# Patient Record
Sex: Female | Born: 1960 | Race: White | Hispanic: No | Marital: Married | Smoking: Former smoker
Health system: Southern US, Community
[De-identification: ages and names within clinical notes are randomized; demographics above are authoritative.]

## PROBLEM LIST (undated history)

## (undated) DIAGNOSIS — C801 Malignant (primary) neoplasm, unspecified: Secondary | ICD-10-CM

## (undated) DIAGNOSIS — N289 Disorder of kidney and ureter, unspecified: Secondary | ICD-10-CM

## (undated) DIAGNOSIS — F32A Depression, unspecified: Secondary | ICD-10-CM

## (undated) DIAGNOSIS — F329 Major depressive disorder, single episode, unspecified: Secondary | ICD-10-CM

## (undated) DIAGNOSIS — F419 Anxiety disorder, unspecified: Secondary | ICD-10-CM

## (undated) HISTORY — PX: TUBAL LIGATION: SHX77

---

## 2004-12-10 ENCOUNTER — Ambulatory Visit: Payer: Self-pay | Admitting: Obstetrics and Gynecology

## 2005-12-09 ENCOUNTER — Encounter: Payer: Self-pay | Admitting: Orthopedic Surgery

## 2005-12-30 ENCOUNTER — Ambulatory Visit: Payer: Self-pay | Admitting: Obstetrics and Gynecology

## 2006-01-03 ENCOUNTER — Ambulatory Visit: Payer: Self-pay | Admitting: Obstetrics and Gynecology

## 2006-07-04 ENCOUNTER — Ambulatory Visit: Payer: Self-pay | Admitting: Obstetrics and Gynecology

## 2007-01-27 ENCOUNTER — Ambulatory Visit: Payer: Self-pay | Admitting: Obstetrics and Gynecology

## 2007-11-09 ENCOUNTER — Ambulatory Visit: Payer: Self-pay | Admitting: Emergency Medicine

## 2008-02-01 ENCOUNTER — Ambulatory Visit: Payer: Self-pay | Admitting: Obstetrics and Gynecology

## 2009-02-02 ENCOUNTER — Ambulatory Visit: Payer: Self-pay | Admitting: Obstetrics and Gynecology

## 2009-05-31 ENCOUNTER — Ambulatory Visit: Payer: Self-pay | Admitting: Family Medicine

## 2009-11-02 ENCOUNTER — Ambulatory Visit: Payer: Self-pay | Admitting: Urology

## 2010-02-12 ENCOUNTER — Ambulatory Visit: Payer: Self-pay | Admitting: Obstetrics and Gynecology

## 2011-02-14 ENCOUNTER — Ambulatory Visit: Payer: Self-pay | Admitting: Obstetrics and Gynecology

## 2011-03-22 ENCOUNTER — Ambulatory Visit: Payer: Self-pay | Admitting: Internal Medicine

## 2012-02-17 ENCOUNTER — Ambulatory Visit: Payer: Self-pay | Admitting: Obstetrics and Gynecology

## 2012-03-24 ENCOUNTER — Ambulatory Visit: Payer: Self-pay | Admitting: Unknown Physician Specialty

## 2012-03-25 LAB — PATHOLOGY REPORT

## 2013-02-24 ENCOUNTER — Ambulatory Visit: Payer: Self-pay | Admitting: Obstetrics and Gynecology

## 2014-03-01 ENCOUNTER — Ambulatory Visit: Payer: Self-pay | Admitting: Obstetrics and Gynecology

## 2014-03-10 ENCOUNTER — Ambulatory Visit: Payer: Self-pay | Admitting: Obstetrics and Gynecology

## 2015-05-08 ENCOUNTER — Other Ambulatory Visit: Payer: Self-pay | Admitting: Family Medicine

## 2015-05-11 ENCOUNTER — Other Ambulatory Visit: Payer: Self-pay | Admitting: Family Medicine

## 2015-05-11 DIAGNOSIS — Z87898 Personal history of other specified conditions: Secondary | ICD-10-CM

## 2015-05-11 DIAGNOSIS — Z8742 Personal history of other diseases of the female genital tract: Secondary | ICD-10-CM

## 2015-05-24 ENCOUNTER — Other Ambulatory Visit: Payer: Self-pay | Admitting: Family Medicine

## 2015-05-24 ENCOUNTER — Ambulatory Visit: Payer: Self-pay

## 2015-05-24 ENCOUNTER — Ambulatory Visit: Payer: BLUE CROSS/BLUE SHIELD

## 2015-05-24 ENCOUNTER — Ambulatory Visit
Admission: RE | Admit: 2015-05-24 | Discharge: 2015-05-24 | Disposition: A | Discharge status: Home / Self Care | Payer: BLUE CROSS/BLUE SHIELD | Source: Ambulatory Visit | Attending: Family Medicine | Admitting: Family Medicine

## 2015-05-24 DIAGNOSIS — Z8742 Personal history of other diseases of the female genital tract: Secondary | ICD-10-CM

## 2015-05-24 DIAGNOSIS — Z87898 Personal history of other specified conditions: Secondary | ICD-10-CM

## 2015-05-24 DIAGNOSIS — N63 Unspecified lump in breast: Secondary | ICD-10-CM | POA: Insufficient documentation

## 2015-05-24 DIAGNOSIS — Z09 Encounter for follow-up examination after completed treatment for conditions other than malignant neoplasm: Secondary | ICD-10-CM | POA: Diagnosis present

## 2015-05-24 HISTORY — DX: Malignant (primary) neoplasm, unspecified: C80.1

## 2015-11-07 ENCOUNTER — Emergency Department
Admission: EM | Admit: 2015-11-07 | Discharge: 2015-11-07 | Disposition: A | Discharge status: Home / Self Care | Payer: BLUE CROSS/BLUE SHIELD

## 2015-11-07 ENCOUNTER — Ambulatory Visit (INDEPENDENT_AMBULATORY_CARE_PROVIDER_SITE_OTHER)
Admit: 2015-11-07 | Discharge: 2015-11-07 | Disposition: A | Discharge status: Home / Self Care | Payer: BLUE CROSS/BLUE SHIELD | Attending: Family Medicine | Admitting: Family Medicine

## 2015-11-07 ENCOUNTER — Encounter: Payer: Self-pay | Admitting: Emergency Medicine

## 2015-11-07 ENCOUNTER — Ambulatory Visit
Admission: EM | Admit: 2015-11-07 | Discharge: 2015-11-07 | Disposition: A | Discharge status: Home / Self Care | Payer: BLUE CROSS/BLUE SHIELD | Attending: Family Medicine | Admitting: Family Medicine

## 2015-11-07 ENCOUNTER — Ambulatory Visit
Admit: 2015-11-07 | Discharge: 2015-11-07 | Disposition: A | Discharge status: Home / Self Care | Payer: BLUE CROSS/BLUE SHIELD | Attending: Family Medicine | Admitting: Family Medicine

## 2015-11-07 DIAGNOSIS — N200 Calculus of kidney: Secondary | ICD-10-CM | POA: Insufficient documentation

## 2015-11-07 DIAGNOSIS — N390 Urinary tract infection, site not specified: Secondary | ICD-10-CM

## 2015-11-07 DIAGNOSIS — N201 Calculus of ureter: Secondary | ICD-10-CM | POA: Diagnosis not present

## 2015-11-07 DIAGNOSIS — K5790 Diverticulosis of intestine, part unspecified, without perforation or abscess without bleeding: Secondary | ICD-10-CM | POA: Diagnosis not present

## 2015-11-07 DIAGNOSIS — R109 Unspecified abdominal pain: Secondary | ICD-10-CM | POA: Diagnosis present

## 2015-11-07 DIAGNOSIS — N1330 Unspecified hydronephrosis: Secondary | ICD-10-CM

## 2015-11-07 HISTORY — DX: Disorder of kidney and ureter, unspecified: N28.9

## 2015-11-07 LAB — BASIC METABOLIC PANEL
Anion gap: 9 (ref 5–15)
BUN: 25 mg/dL — AB (ref 6–20)
CALCIUM: 8.8 mg/dL — AB (ref 8.9–10.3)
CO2: 22 mmol/L (ref 22–32)
CREATININE: 0.65 mg/dL (ref 0.44–1.00)
Chloride: 104 mmol/L (ref 101–111)
GFR calc non Af Amer: >60 mL/min (ref >60)
Glucose, Bld: 120 mg/dL — ABNORMAL HIGH (ref 65–99)
Potassium: 3.7 mmol/L (ref 3.5–5.1)
SODIUM: 135 mmol/L (ref 135–145)

## 2015-11-07 LAB — URINALYSIS COMPLETE WITH MICROSCOPIC (ARMC ONLY)
Glucose, UA: NEGATIVE mg/dL
Ketones, ur: NEGATIVE mg/dL
NITRITE: NEGATIVE
PH: 6 (ref 5.0–8.0)
Protein, ur: 100 mg/dL — AB
SPECIFIC GRAVITY, URINE: 1.025 (ref 1.005–1.030)

## 2015-11-07 MED ORDER — HYDROCODONE-ACETAMINOPHEN 5-325 MG PO TABS
1.0000 | ORAL_TABLET | Freq: Once | ORAL | Status: AC
  Administered 2015-11-07: 1 via ORAL

## 2015-11-07 MED ORDER — CIPROFLOXACIN HCL 500 MG PO TABS: 500.0000 mg | ORAL_TABLET | Freq: Two times a day (BID) | ORAL | Status: DC

## 2015-11-07 MED ORDER — KETOROLAC TROMETHAMINE 60 MG/2ML IM SOLN
60.0000 mg | Freq: Once | INTRAMUSCULAR | Status: AC
  Administered 2015-11-07: 60 mg via INTRAMUSCULAR

## 2015-11-07 MED ORDER — TAMSULOSIN HCL 0.4 MG PO CAPS: 0.4000 mg | ORAL_CAPSULE | Freq: Every day | ORAL | Status: DC

## 2015-11-07 MED ORDER — HYDROCODONE-ACETAMINOPHEN 5-325 MG PO TABS: 1.0000 | ORAL_TABLET | Freq: Four times a day (QID) | ORAL | Status: DC | PRN

## 2015-11-07 MED ORDER — ONDANSETRON 8 MG PO TBDP
8.0000 mg | ORAL_TABLET | Freq: Once | ORAL | Status: AC
  Administered 2015-11-07: 8 mg via ORAL

## 2015-11-07 NOTE — ED Notes (Signed)
Patient scheduled for Renal Ultrasound for 1:30pm today at [facility].

## 2015-11-07 NOTE — ED Notes (Signed)
Patient c/o burning when urinating, blood in urine and left sided flank pain that started last night. Patient reports some nausea.

## 2015-11-07 NOTE — ED Provider Notes (Signed)
CSN: [id number]     Arrival date & time 11/07/15  1039 History   First MD Initiated Contact with Patient 11/07/15 1250     Chief Complaint  Patient presents with  . Dysuria   (Consider location/radiation/quality/duration/timing/severity/associated sxs/prior Treatment) HPI Comments: 54 yo female with a 1 day h/o burning with urination, blood in urine and left sided flank pain, associated with nausea. Denies any fevers, chills, vomiting, diarrhea.  The history is provided by the patient.    Past Medical History  Diagnosis Date  . Depression   . Anxiety   . Renal disorder     stones  . Cancer ([place])     skin cancer - squamous carcina   Past Surgical History  Procedure Laterality Date  . Cesarean section    . Cystoscopy with stent placement Bilateral 11/09/2015    Procedure: CYSTOSCOPY WITH STENT PLACEMENT;  Surgeon: Royston Cowper, MD;  Location: [facility] ORS;  Service: Urology;  Laterality: Bilateral;   History reviewed. No pertinent family history. Social History  Substance Use Topics  . Smoking status: Former Research scientist (life sciences)  . Smokeless tobacco: None  . Alcohol Use: Yes   OB History    No data available     Review of Systems  Allergies  Review of patient's allergies indicates no known allergies.  Home Medications   Prior to Admission medications   Medication Sig Start Date End Date Taking? Authorizing Provider  venlafaxine (EFFEXOR) 37.5 MG tablet Take 37.5 mg by mouth daily.   Yes Historical Provider, MD  ciprofloxacin (CIPRO) 500 MG tablet Take 1 tablet (500 mg total) by mouth every 12 (twelve) hours. 11/07/15   Norval Gable, MD  ciprofloxacin (CIPRO) 500 MG tablet Take 1 tablet (500 mg total) by mouth 2 (two) times daily. 11/09/15   Royston Cowper, MD  docusate sodium (COLACE) 100 MG capsule Take 2 capsules (200 mg total) by mouth 2 (two) times daily. 11/09/15   Royston Cowper, MD  HYDROcodone-acetaminophen (NORCO/VICODIN) 5-325 MG tablet Take 1-2 tablets by mouth every 6  (six) hours as needed. 11/07/15   Norval Gable, MD  ondansetron (ZOFRAN-ODT) 8 MG disintegrating tablet Take 8 mg by mouth every 8 (eight) hours as needed for nausea or vomiting.    Historical Provider, MD  promethazine (PHENERGAN) 25 MG suppository Place 1 suppository (25 mg total) rectally every 6 (six) hours as needed for nausea or vomiting. 11/09/15   Royston Cowper, MD  tapentadol (NUCYNTA) 50 MG TABS tablet Take 50 mg by mouth every 6 (six) hours as needed.    Historical Provider, MD  tolterodine (DETROL LA) 4 MG 24 hr capsule Take 1 capsule (4 mg total) by mouth daily. 11/09/15   Royston Cowper, MD   Meds Ordered and Administered this Visit   Medications  ondansetron (ZOFRAN-ODT) disintegrating tablet 8 mg (8 mg Oral Given 11/07/15 1301)  ketorolac (TORADOL) injection 60 mg (60 mg Intramuscular Given 11/07/15 1301)  HYDROcodone-acetaminophen (NORCO/VICODIN) 5-325 MG per tablet 1 tablet (1 tablet Oral Given 11/07/15 1456)    BP 144/88 mmHg  Pulse 68  Temp(Src) 97.7 F (36.5 C) (Tympanic)  Resp 16  Ht 5\' 9"  (1.753 m)  Wt 160 lb (72.576 kg)  BMI 23.62 kg/m2  SpO2 98% No data found.   Physical Exam  Constitutional: She appears well-developed and well-nourished. No distress.  Neck: Neck supple.  Abdominal: Soft. Bowel sounds are normal. She exhibits no distension and no mass. There is tenderness (suprapubic and left CVA). There  is no rebound and no guarding.  Skin: No rash noted. She is not diaphoretic.  Nursing note and vitals reviewed.   ED Course  Procedures (including critical care time)  Labs Review Labs Reviewed  URINALYSIS COMPLETEWITH MICROSCOPIC ([facility] ONLY) - Abnormal; Notable for the following:    Color, Urine RED (*)    APPearance CLOUDY (*)    Bilirubin Urine 1+ (*)    Hgb urine dipstick 3+ (*)    Protein, ur 100 (*)    Leukocytes, UA TRACE (*)    Bacteria, UA MANY (*)    Squamous Epithelial / LPF 0-5 (*)    All other components within normal limits  BASIC  METABOLIC PANEL - Abnormal; Notable for the following:    Glucose, Bld 120 (*)    BUN 25 (*)    Calcium 8.8 (*)    All other components within normal limits  URINE CULTURE    Imaging Review No results found.   Visual Acuity Review  Right Eye Distance:   Left Eye Distance:   Bilateral Distance:    Right Eye Near:   Left Eye Near:    Bilateral Near:         MDM   1. Hydronephrosis, left   2. UTI (lower urinary tract infection)     Discharge Medication List as of 11/07/2015  2:58 PM    START taking these medications   Details  ciprofloxacin (CIPRO) 500 MG tablet Take 1 tablet (500 mg total) by mouth every 12 (twelve) hours., Starting 11/07/2015, Until Discontinued, Print    HYDROcodone-acetaminophen (NORCO/VICODIN) 5-325 MG tablet Take 1-2 tablets by mouth every 6 (six) hours as needed., Starting 11/07/2015, Until Discontinued, Print    tamsulosin (FLOMAX) 0.4 MG CAPS capsule Take 1 capsule (0.4 mg total) by mouth daily., Starting 11/07/2015, Until Discontinued, Print      1. Labs/x-ray results (renal US) and diagnosis reviewed with patient 2. rx as per orders above; reviewed possible side effects, interactions, risks and benefits  3. Patient given toradol 60mg  IM x 1 with improvement of symptoms 4. Recommend Renal CT scan to [facility] done [facility] at [facility]; further management pending results of CT scan 5. supportive treatment with increased fluids/water 6. Follow-up prn if symptoms worsen or don't improve  Norval Gable, MD 11/15/15 1737

## 2015-11-08 ENCOUNTER — Inpatient Hospital Stay: Admission: RE | Admit: 2015-11-08 | Payer: BLUE CROSS/BLUE SHIELD | Source: Ambulatory Visit

## 2015-11-08 ENCOUNTER — Encounter: Payer: Self-pay | Admitting: *Deleted

## 2015-11-08 NOTE — H&P (Signed)
NAMESEBASTIAN, WALBY               ACCOUNT NO.:  [id number]  MEDICAL RECORD NO.:  [id number]  LOCATION:  [facility]                       FACILITY:  [facility]  PHYSICIAN:  Maryan Puls          DATE OF BIRTH:  20-Nov-1961  DATE OF ADMISSION:  11/07/2015 DATE OF DISCHARGE:  11/07/2015                            HISTORY AND PHYSICAL   Same-day surgery November 09, 2015.  CHIEF COMPLAINT:  Left flank pain.  HISTORY OF PRESENT ILLNESS:  Mrs. Mcnichol is a 54 year old female with a 3-day history of left flank pain associated with hematuria.  CT scan revealed that she had 2 mid left ureteral stones with hydronephrosis obstructing the ureter.  The distal stone measured 7 mm and the proximal stone measured 4 mm.  She also has bilateral renal calculi present.  She comes in now for left ureteroscopic ureterolithotomy with holmium laser lithotripsy and bilateral stent placement.  PAST MEDICAL HISTORY:  No drug allergies.  CURRENT MEDICATIONS:  Effexor, Nasacort, ibuprofen, Cipro, tamsulosin, hydrocodone, Nucynta, and Zuplenz.  PREVIOUS SURGICAL PROCEDURE:  Includes C-section in 1993.  SOCIAL HISTORY:  Patient quit smoking in 2015 with a 22 pack year history.  She consumes 8 alcoholic beverages per week.  FAMILY HISTORY:  Mother died of stroke at age 30.  Father died at age 46 with COPD.  Brother is living, aged 57 with heart disease.  PAST AND CURRENT MEDICAL CONDITIONS: 1. Recurrent kidney stone disease. 2. Arthritis. 3. Insomnia. 4. Allergic rhinitis.  REVIEW OF SYSTEMS:  Patient denied chest pain, shortness of breath, diabetes, stroke, or hypertension.  PHYSICAL EXAMINATION:  GENERAL:  Generally well-nourished white female, in no acute distress. HEENT:  Sclerae were clear.  Pupils were equally round, react to light and accommodation.  Extraocular movements were intact. NECK:  Supple.  No palpable cervical adenopathy. LUNGS:  Clear to auscultation. CARDIOVASCULAR:  Regular rhythm and  rate without audible murmurs. ABDOMEN:  Soft, nontender abdomen. GU:  Deferred. RECTAL:  Deferred.  IMPRESSION: 1. Left ureterolithiasis with renal colic and hydronephrosis. 2. Bilateral nephrolithiasis.  PLAN: 1. Left ureteroscopic ureterolithotomy with holmium laser lithotripsy     and stent placement. 2. Bilateral stent placement.          ______________________________ Maryan Puls     MW/MEDQ  D:  11/08/2015  T:  11/08/2015  Job:  [id number]

## 2015-11-08 NOTE — Patient Instructions (Signed)
  Your procedure is scheduled on:11/09/15 Report to Day Surgery. [facility] SECOND FLOOR To find out your arrival time please call [phone] between 1PM - 3PM on 11/08/15.  Remember: Instructions that are not followed completely may result in serious medical risk, up to and including death, or upon the discretion of your surgeon and anesthesiologist your surgery may need to be rescheduled.    _X___ 1. Do not eat food or drink liquids after midnight. No gum chewing or hard candies.     _X___ 2. No Alcohol for 24 hours before or after surgery.   ____ 3. Bring all medications with you on the day of surgery if instructed.    __X__ 4. Notify your doctor if there is any change in your medical condition     (cold, fever, infections).     Do not wear jewelry, make-up, hairpins, clips or nail polish.  Do not wear lotions, powders, or perfumes. You may wear deodorant.  Do not shave 48 hours prior to surgery. Men may shave face and neck.  Do not bring valuables to the hospital.    [facility] is not responsible for any belongings or valuables.               Contacts, dentures or bridgework may not be worn into surgery.  Leave your suitcase in the car. After surgery it may be brought to your room.  For patients admitted to the hospital, discharge time is determined by your                treatment team.   Patients discharged the day of surgery will not be allowed to drive home.   Please read over the following fact sheets that you were given:   Surgical Site Infection Prevention   ____ Take these medicines the morning of surgery with A SIP OF WATER:    1. EFFEXOR  2.   3.   4.  5.  6.  ____ Fleet Enema (as directed)   ____ Use CHG Soap as directed  ____ Use inhalers on the day of surgery  ____ Stop metformin 2 days prior to surgery    ____ Take 1/2 of usual insulin dose the night before surgery and none on the morning of surgery.   ____ Stop Coumadin/Plavix/aspirin on ____  Stop Anti-inflammatories on   ____ Stop supplements until after surgery.    ____ Bring C-Pap to the hospital.

## 2015-11-09 ENCOUNTER — Ambulatory Visit: Payer: BLUE CROSS/BLUE SHIELD | Admitting: Anesthesiology

## 2015-11-09 ENCOUNTER — Encounter: Payer: Self-pay | Admitting: *Deleted

## 2015-11-09 ENCOUNTER — Ambulatory Visit
Admission: RE | Admit: 2015-11-09 | Discharge: 2015-11-09 | Disposition: A | Discharge status: Home / Self Care | Payer: BLUE CROSS/BLUE SHIELD | Source: Ambulatory Visit | Attending: Urology | Admitting: Urology

## 2015-11-09 ENCOUNTER — Encounter
Admission: RE | Disposition: A | Discharge status: Home / Self Care | Payer: Self-pay | Source: Ambulatory Visit | Attending: Urology

## 2015-11-09 DIAGNOSIS — Z825 Family history of asthma and other chronic lower respiratory diseases: Secondary | ICD-10-CM | POA: Diagnosis not present

## 2015-11-09 DIAGNOSIS — M1990 Unspecified osteoarthritis, unspecified site: Secondary | ICD-10-CM | POA: Insufficient documentation

## 2015-11-09 DIAGNOSIS — Z823 Family history of stroke: Secondary | ICD-10-CM | POA: Insufficient documentation

## 2015-11-09 DIAGNOSIS — Z87891 Personal history of nicotine dependence: Secondary | ICD-10-CM | POA: Diagnosis not present

## 2015-11-09 DIAGNOSIS — J309 Allergic rhinitis, unspecified: Secondary | ICD-10-CM | POA: Insufficient documentation

## 2015-11-09 DIAGNOSIS — Z79899 Other long term (current) drug therapy: Secondary | ICD-10-CM | POA: Insufficient documentation

## 2015-11-09 DIAGNOSIS — N202 Calculus of kidney with calculus of ureter: Secondary | ICD-10-CM | POA: Diagnosis present

## 2015-11-09 DIAGNOSIS — N136 Pyonephrosis: Secondary | ICD-10-CM | POA: Diagnosis not present

## 2015-11-09 DIAGNOSIS — Z8249 Family history of ischemic heart disease and other diseases of the circulatory system: Secondary | ICD-10-CM | POA: Insufficient documentation

## 2015-11-09 DIAGNOSIS — N132 Hydronephrosis with renal and ureteral calculous obstruction: Secondary | ICD-10-CM | POA: Insufficient documentation

## 2015-11-09 DIAGNOSIS — G4700 Insomnia, unspecified: Secondary | ICD-10-CM | POA: Insufficient documentation

## 2015-11-09 HISTORY — PX: CYSTOSCOPY WITH STENT PLACEMENT: SHX5790

## 2015-11-09 HISTORY — DX: Anxiety disorder, unspecified: F41.9

## 2015-11-09 HISTORY — DX: Depression, unspecified: F32.A

## 2015-11-09 HISTORY — DX: Major depressive disorder, single episode, unspecified: F32.9

## 2015-11-09 LAB — URINE CULTURE: Special Requests: NORMAL

## 2015-11-09 SURGERY — CYSTOSCOPY, WITH STENT INSERTION
Anesthesia: General | Laterality: Left | Wound class: Clean Contaminated

## 2015-11-09 MED ORDER — PROMETHAZINE HCL 25 MG RE SUPP: 25.0000 mg | Freq: Four times a day (QID) | RECTAL | Status: DC | PRN

## 2015-11-09 MED ORDER — TOLTERODINE TARTRATE ER 4 MG PO CP24: 4.0000 mg | ORAL_CAPSULE | Freq: Every day | ORAL | Status: DC

## 2015-11-09 MED ORDER — ONDANSETRON HCL 4 MG/2ML IJ SOLN: 4.0000 mg | Freq: Once | INTRAMUSCULAR | Status: DC | PRN

## 2015-11-09 MED ORDER — LIDOCAINE HCL (CARDIAC) 20 MG/ML IV SOLN
INTRAVENOUS | Status: DC | PRN
  Administered 2015-11-09: 100 mg via INTRAVENOUS

## 2015-11-09 MED ORDER — DOCUSATE SODIUM 100 MG PO CAPS: 200.0000 mg | ORAL_CAPSULE | Freq: Two times a day (BID) | ORAL | Status: DC

## 2015-11-09 MED ORDER — FAMOTIDINE 20 MG PO TABS
ORAL_TABLET | ORAL | Status: AC
  Administered 2015-11-09: 20 mg via ORAL
  Filled 2015-11-09: qty 1

## 2015-11-09 MED ORDER — MEPERIDINE HCL 25 MG/ML IJ SOLN
INTRAMUSCULAR | Status: AC
  Administered 2015-11-09: 12.5 mg
  Filled 2015-11-09: qty 1

## 2015-11-09 MED ORDER — SODIUM CHLORIDE 0.9 % IJ SOLN
INTRAMUSCULAR | Status: AC
  Filled 2015-11-09: qty 3

## 2015-11-09 MED ORDER — LEVOFLOXACIN IN D5W 500 MG/100ML IV SOLN
INTRAVENOUS | Status: AC
  Administered 2015-11-09: 500 mg via INTRAVENOUS
  Filled 2015-11-09: qty 100

## 2015-11-09 MED ORDER — LEVOFLOXACIN IN D5W 500 MG/100ML IV SOLN: 500.0000 mg | INTRAVENOUS | Status: DC

## 2015-11-09 MED ORDER — BELLADONNA ALKALOIDS-OPIUM 16.2-60 MG RE SUPP
RECTAL | Status: AC
  Filled 2015-11-09: qty 1

## 2015-11-09 MED ORDER — FENTANYL CITRATE (PF) 100 MCG/2ML IJ SOLN
INTRAMUSCULAR | Status: DC | PRN
  Administered 2015-11-09: 100 ug via INTRAVENOUS

## 2015-11-09 MED ORDER — KETOROLAC TROMETHAMINE 30 MG/ML IJ SOLN
INTRAMUSCULAR | Status: DC | PRN
  Administered 2015-11-09: 30 mg via INTRAVENOUS

## 2015-11-09 MED ORDER — FENTANYL CITRATE (PF) 100 MCG/2ML IJ SOLN: 25.0000 ug | INTRAMUSCULAR | Status: DC | PRN

## 2015-11-09 MED ORDER — LIDOCAINE HCL 2 % EX GEL
CUTANEOUS | Status: AC
  Filled 2015-11-09: qty 10

## 2015-11-09 MED ORDER — LIDOCAINE HCL 2 % EX GEL
CUTANEOUS | Status: DC | PRN
  Administered 2015-11-09: 1

## 2015-11-09 MED ORDER — PROPOFOL 10 MG/ML IV BOLUS
INTRAVENOUS | Status: DC | PRN
  Administered 2015-11-09: 100 mg via INTRAVENOUS

## 2015-11-09 MED ORDER — LACTATED RINGERS IV SOLN
INTRAVENOUS | Status: DC
  Administered 2015-11-09 (×2): via INTRAVENOUS

## 2015-11-09 MED ORDER — KETAMINE HCL 10 MG/ML IJ SOLN
INTRAMUSCULAR | Status: DC | PRN
  Administered 2015-11-09: 40 mg via INTRAVENOUS

## 2015-11-09 MED ORDER — BELLADONNA ALKALOIDS-OPIUM 16.2-60 MG RE SUPP
RECTAL | Status: DC | PRN
  Administered 2015-11-09: 1 via RECTAL

## 2015-11-09 MED ORDER — MIDAZOLAM HCL 2 MG/2ML IJ SOLN
INTRAMUSCULAR | Status: DC | PRN
  Administered 2015-11-09: 2 mg via INTRAVENOUS

## 2015-11-09 MED ORDER — CIPROFLOXACIN HCL 500 MG PO TABS: 500.0000 mg | ORAL_TABLET | Freq: Two times a day (BID) | ORAL | Status: DC

## 2015-11-09 MED ORDER — FAMOTIDINE 20 MG PO TABS
20.0000 mg | ORAL_TABLET | Freq: Once | ORAL | Status: AC
  Administered 2015-11-09: 20 mg via ORAL

## 2015-11-09 SURGICAL SUPPLY — 30 items
BAG DRAIN CYSTO-URO LG1000N (MISCELLANEOUS) ×3 IMPLANT
CNTNR SPEC 2.5X3XGRAD LEK (MISCELLANEOUS) ×2
CONRAY 43 FOR UROLOGY 50M (MISCELLANEOUS) ×3 IMPLANT
CONT SPEC 4OZ STER OR WHT (MISCELLANEOUS) ×1
CONTAINER SPEC 2.5X3XGRAD LEK (MISCELLANEOUS) ×2 IMPLANT
FEE TECHNICIAN ONLY PER HOUR (MISCELLANEOUS) IMPLANT
GLOVE BIO SURGEON STRL SZ7 (GLOVE) ×6 IMPLANT
GLOVE BIO SURGEON STRL SZ7.5 (GLOVE) ×3 IMPLANT
GOWN STRL REUS W/ TWL LRG LVL4 (GOWN DISPOSABLE) ×2 IMPLANT
GOWN STRL REUS W/ TWL XL LVL3 (GOWN DISPOSABLE) ×2 IMPLANT
GOWN STRL REUS W/TWL LRG LVL4 (GOWN DISPOSABLE) ×1
GOWN STRL REUS W/TWL XL LVL3 (GOWN DISPOSABLE) ×1
GOWN STRL REUS W/TWL XL LVL4 (GOWN DISPOSABLE) ×3 IMPLANT
GUIDEWIRE STR ZIPWIRE 035X150 (MISCELLANEOUS) ×3 IMPLANT
KIT RM TURNOVER CYSTO AR (KITS) ×3 IMPLANT
LASER HOLMIUM FIBER SU 272UM (MISCELLANEOUS) IMPLANT
LASER HOLMIUM FIBER SU 365UM (MISCELLANEOUS) ×3 IMPLANT
LASER HOLMIUM STANDBY (MISCELLANEOUS) ×3 IMPLANT
LASER HOLMIUM SU 940UM (MISCELLANEOUS) IMPLANT
PACK CYSTO AR (MISCELLANEOUS) ×3 IMPLANT
PREP PVP WINGED SPONGE (MISCELLANEOUS) ×3 IMPLANT
SET CYSTO W/LG BORE CLAMP LF (SET/KITS/TRAYS/PACK) ×3 IMPLANT
SOL .9 NS 3000ML IRR  AL (IV SOLUTION) ×1
SOL .9 NS 3000ML IRR UROMATIC (IV SOLUTION) ×2 IMPLANT
SOL PREP PVP 2OZ (MISCELLANEOUS) ×3
SOLUTION PREP PVP 2OZ (MISCELLANEOUS) ×2 IMPLANT
STENT URET 6FRX24 CONTOUR (STENTS) ×6 IMPLANT
STENT URET 6FRX26 CONTOUR (STENTS) IMPLANT
SURGILUBE 2OZ TUBE FLIPTOP (MISCELLANEOUS) ×3 IMPLANT
WATER STERILE IRR 1000ML POUR (IV SOLUTION) ×3 IMPLANT

## 2015-11-09 NOTE — Anesthesia Preprocedure Evaluation (Addendum)
Anesthesia Evaluation  Patient identified by MRN, date of birth, ID band Patient awake    Reviewed: Allergy & Precautions, NPO status , Patient's Chart, lab work & pertinent test results  Airway Mallampati: II       Dental  (+) Teeth Intact   Pulmonary neg pulmonary ROS, former smoker,    breath sounds clear to auscultation       Cardiovascular Exercise Tolerance: Good negative cardio ROS   Rhythm:Regular Rate:Normal     Neuro/Psych    GI/Hepatic negative GI ROS, Neg liver ROS,   Endo/Other  negative endocrine ROS  Renal/GU      Musculoskeletal negative musculoskeletal ROS (+)   Abdominal Normal abdominal exam  (+)   Peds  Hematology negative hematology ROS (+)   Anesthesia Other Findings   Reproductive/Obstetrics                            Anesthesia Physical Anesthesia Plan  ASA: II  Anesthesia Plan: General   Post-op Pain Management:    Induction: Intravenous  Airway Management Planned: LMA  Additional Equipment:   Intra-op Plan:   Post-operative Plan: Extubation in OR  Informed Consent: I have reviewed the patients History and Physical, chart, labs and discussed the procedure including the risks, benefits and alternatives for the proposed anesthesia with the patient or authorized representative who has indicated his/her understanding and acceptance.     Plan Discussed with: CRNA  Anesthesia Plan Comments:         Anesthesia Quick Evaluation

## 2015-11-09 NOTE — Op Note (Signed)
Preoperative diagnosis: 1. Left ureterolithiasis                                             2. Bilateral nephrolithiasis Postoperative diagnosis: 1. Left ureterolithiasis                                              2. Bilateral nephrolithiasis                                               3. Left pyonephrosis  Procedure: 1. Left ureteroscopy                      2. Bilateral double pigtail stent placement                       3. Fluoroscopy  Surgeon: Otelia Limes. Yves Dill MD, FACS Anesthesia: Gen.  Indications:See the history and physical. After informed consent the above procedure(s) were requested     Technique and findings: After adequate general anesthesia been obtained patient was placed into dorsal lithotomy position and the perineum was prepped and draped in the usual fashion. Fluoroscopy revealed numerous bilateral renal calculi. Patient had 2 proximal left ureteral stones. At this point the cystoscope was coupled to the camera and placed into the bladder. The bladder was thoroughly inspected. A 0.035 Glidewire was advanced up the left ureter under fluoroscopic guidance. The guidewire was then passed the 2 stones. The most proximal stone was pushed back into the kidney. Large amount of purulent fluid is then observed exiting the left ureteral orifice. After he purulent material had cleared somewhat the cystoscope was removed and the mini rigid ureteroscope advanced into the left ureter. The ureteroscope could only be advanced up to the level of the stones but there was significant edema of the ureter in this location and the stone could not be engaged. Therefore the ureteroscope was removed and the cystoscope was backloaded over the guidewire. A 6 x 24 cm double-pigtail stent was then advanced over the guidewire and positioned in the left ureter. The guidewire was then removed taking care to leave the stent in position. At this point a 0.035 guidewire was advanced up the right ureter under  fluoroscopic guidance. A 6 x 24 cm double-pigtail stent was then advanced over the guidewire and positioned in the right ureter. The guidewire was then removed taking care leave the stent in position. The bladder was then drained and cystoscope was removed. 10 cc of viscous Xylocaine was instilled within the urethra and the bladder. A B&O suppository was placed. Procedure was then terminated and patient transferred to the recovery room in stable condition.

## 2015-11-09 NOTE — Transfer of Care (Signed)
Immediate Anesthesia Transfer of Care Note  Patient: Alison Osborne  Procedure(s) Performed: Procedure(s): CYSTOSCOPY WITH STENT PLACEMENT (Bilateral)  Patient Location: PACU  Anesthesia Type:General  Level of Consciousness: awake, sedated and patient cooperative  Airway & Oxygen Therapy: Patient Spontanous Breathing and Patient connected to nasal cannula oxygen  Post-op Assessment: Report given to RN and Post -op Vital signs reviewed and stable  Post vital signs: Reviewed and stable  Last Vitals:  Filed Vitals:   11/09/15 1234 11/09/15 1443  BP: 129/78 119/76  Pulse: 90 83  Temp: 36.8 C 37 C  Resp: 16 17    Complications: No apparent anesthesia complications

## 2015-11-09 NOTE — Discharge Instructions (Addendum)
Overactive Bladder, Adult Overactive bladder is a group of urinary symptoms. With overactive bladder, you may suddenly feel the need to pass urine (urinate) right away. After feeling this sudden urge, you might also leak urine if you cannot get to the bathroom fast enough (urinary incontinence). These symptoms might interfere with your daily work or social activities. Overactive bladder symptoms may also wake you up at night. Overactive bladder affects the nerve signals between your bladder and your brain. Your bladder may get the signal to empty before it is full. Very sensitive muscles can also make your bladder squeeze too soon. CAUSES Many things can cause an overactive bladder. Possible causes include:  Urinary tract infection.  Infection of nearby tissues, such as the prostate.  Prostate enlargement.  Being pregnant with twins or more (multiples).  Surgery on the uterus or urethra.  Bladder stones, inflammation, or tumors.  Drinking too much caffeine or alcohol.  Certain medicines, especially those that you take to help your body get rid of extra fluid (diuretics) by increasing urine production.  Muscle or nerve weakness, especially from:  A spinal cord injury.  Stroke.  Multiple sclerosis.  Parkinson disease.  Diabetes. This can cause a high urine volume that fills the bladder so quickly that the normal urge to urinate is triggered very strongly.  Constipation. A buildup of too much stool can put pressure on your bladder. RISK FACTORS You may be at greater risk for overactive bladder if you:  Are an older adult.  Smoke.  Are going through menopause.  Have prostate problems.  Have a neurological disease, such as stroke, dementia, Parkinson disease, or multiple sclerosis (MS).  Eat or drink things that irritate the bladder. These include alcohol, spicy food, and caffeine.  Are overweight or obese. SIGNS AND SYMPTOMS  The signs and symptoms of an overactive  bladder include:  Sudden, strong urges to urinate.  Leaking urine.  Urinating eight or more times per day.  Waking up to urinate two or more times per night. DIAGNOSIS Your health care provider may suspect overactive bladder based on your symptoms. The health care provider will do a physical exam and take your medical history. Blood or urine tests may also be done. For example, you might need to have a bladder function test to check how well you can hold your urine. You might also need to see a health care provider who specializes in the urinary tract (urologist). TREATMENT Treatment for overactive bladder depends on the cause of your condition and whether it is mild or severe. Certain treatments can be done in your health care provider's office or clinic. You can also make lifestyle changes at home. Options include: Behavioral Treatments  Biofeedback. A specialist uses sensors to help you become aware of your body's signals.  Keeping a daily log of when you need to urinate and what happens after the urge. This may help you manage your condition.  Bladder training. This helps you learn to control the urge to urinate by following a schedule that directs you to urinate at regular intervals (timed voiding). At first, you might have to wait a few minutes after feeling the urge. In time, you should be able to schedule bathroom visits an hour or more apart.  Kegel exercises. These are exercises to strengthen the pelvic floor muscles, which support the bladder. Toning these muscles can help you control urination, even if your bladder muscles are overactive. A specialist will teach you how to do these exercises correctly. They  require daily practice.  Weight loss. If you are obese or overweight, losing weight might relieve your symptoms of overactive bladder. Talk to your health care provider about losing weight and whether there is a specific program or method that would work best for you.  Diet  change. This might help if constipation is making your overactive bladder worse. Your health care provider or a dietitian can explain ways to change what you eat to ease constipation. You might also need to consume less alcohol and caffeine or drink other fluids at different times of the day.  Stopping smoking.  Wearing pads to absorb leakage while you wait for other treatments to take effect. Physical Treatments  Electrical stimulation. Electrodes send gentle pulses of electricity to strengthen the nerves or muscles that help to control the bladder. Sometimes, the electrodes are placed outside of the body. In other cases, they might be placed inside the body (implanted). This treatment can take several months to have an effect.  Supportive devices. Women may need a plastic device that fits into the vagina and supports the bladder (pessary). Medicines Several medicines can help treat overactive bladder and are usually used along with other treatments. Some are injected into the muscles involved in urination. Others come in pill form. Your health care provider may prescribe:  Antispasmodics. These medicines block the signals that the nerves send to the bladder. This keeps the bladder from releasing urine at the wrong time.  Tricyclic antidepressants. These types of antidepressants also relax bladder muscles. Surgery  You may have a device implanted to help manage the nerve signals that indicate when you need to urinate.  You may have surgery to implant electrodes for electrical stimulation.  Sometimes, very severe cases of overactive bladder require surgery to change the shape of the bladder. HOME CARE INSTRUCTIONS   Take medicines only as directed by your health care provider.  Use any implants or a pessary as directed by your health care provider.  Make any diet or lifestyle changes that are recommended by your health care provider. These might include:  Drinking less fluid or  drinking at different times of the day. If you need to urinate often during the night, you may need to stop drinking fluids early in the evening.  Cutting down on caffeine or alcohol. Both can make an overactive bladder worse. Caffeine is found in coffee, tea, and sodas.  Doing Kegel exercises to strengthen muscles.  Losing weight if you need to.  Eating a healthy and balanced diet to prevent constipation.  Keep a journal or log to track how much and when you drink and also when you feel the need to urinate. This will help your health care provider to monitor your condition. SEEK MEDICAL CARE IF:  Your symptoms do not get better after treatment.  Your pain and discomfort are getting worse.  You have more frequent urges to urinate.  You have a fever. SEEK IMMEDIATE MEDICAL CARE IF: You are not able to control your bladder at all.   This information is not intended to replace advice given to you by your health care provider. Make sure you discuss any questions you have with your health care provider.   Document Released: 09/14/2009 Document Revised: 12/09/2014 Document Reviewed: 04/13/2014 Elsevier Interactive Patient Education 2016 [employer] Urinary Tract Infection Urinary tract infections (UTIs) can develop anywhere along your urinary tract. Your urinary tract is your body's drainage system for removing wastes and extra water. Your urinary tract includes two  kidneys, two ureters, a bladder, and a urethra. Your kidneys are a pair of bean-shaped organs. Each kidney is about the size of your fist. They are located below your ribs, one on each side of your spine. CAUSES Infections are caused by microbes, which are microscopic organisms, including fungi, viruses, and bacteria. These organisms are so small that they can only be seen through a microscope. Bacteria are the microbes that most commonly cause UTIs. SYMPTOMS  Symptoms of UTIs may vary by age and gender of the patient and by  the location of the infection. Symptoms in young women typically include a frequent and intense urge to urinate and a painful, burning feeling in the bladder or urethra during urination. Older women and men are more likely to be tired, shaky, and weak and have muscle aches and abdominal pain. A fever may mean the infection is in your kidneys. Other symptoms of a kidney infection include pain in your back or sides below the ribs, nausea, and vomiting. DIAGNOSIS To diagnose a UTI, your caregiver will ask you about your symptoms. Your caregiver will also ask you to provide a urine sample. The urine sample will be tested for bacteria and white blood cells. White blood cells are made by your body to help fight infection. TREATMENT  Typically, UTIs can be treated with medication. Because most UTIs are caused by a bacterial infection, they usually can be treated with the use of antibiotics. The choice of antibiotic and length of treatment depend on your symptoms and the type of bacteria causing your infection. HOME CARE INSTRUCTIONS  If you were prescribed antibiotics, take them exactly as your caregiver instructs you. Finish the medication even if you feel better after you have only taken some of the medication.  Drink enough water and fluids to keep your urine clear or pale yellow.  Avoid caffeine, tea, and carbonated beverages. They tend to irritate your bladder.  Empty your bladder often. Avoid holding urine for long periods of time.  Empty your bladder before and after sexual intercourse.  After a bowel movement, women should cleanse from front to back. Use each tissue only once. SEEK MEDICAL CARE IF:   You have back pain.  You develop a fever.  Your symptoms do not begin to resolve within 3 days. SEEK IMMEDIATE MEDICAL CARE IF:   You have severe back pain or lower abdominal pain.  You develop chills.  You have nausea or vomiting.  You have continued burning or discomfort with  urination. MAKE SURE YOU:   Understand these instructions.  Will watch your condition.  Will get help right away if you are not doing well or get worse.   This information is not intended to replace advice given to you by your health care provider. Make sure you discuss any questions you have with your health care provider.   Document Released: 08/28/2005 Document Revised: 08/09/2015 Document Reviewed: 12/27/2011 Elsevier Interactive Patient Education 2016 [employer] Inc[employer] Ureteral Stent Implantation, Care After Refer to this sheet in the next few weeks. These instructions provide you with information on caring for yourself after your procedure. Your health care provider may also give you more specific instructions. Your treatment has been planned according to current medical practices, but problems sometimes occur. Call your health care provider if you have any problems or questions after your procedure. WHAT TO EXPECT AFTER THE PROCEDURE You should be back to normal activity within 48 hours after the procedure. Nausea and vomiting may occur and are  commonly the result of anesthesia. It is common to experience sharp pain in the back or lower abdomen and penis with voiding. This is caused by movement of the ends of the stent with the act of urinating.It usually goes away within minutes after you have stopped urinating. HOME CARE INSTRUCTIONS Make sure to drink plenty of fluids. You may have small amounts of bleeding, causing your urine to be red. This is normal. Certain movements may trigger pain or a feeling that you need to urinate. You may be given medicines to prevent infection or bladder spasms. Be sure to take all medicines as directed. Only take over-the-counter or prescription medicines for pain, discomfort, or fever as directed by your health care provider. Do not take aspirin, as this can make bleeding worse. Your stent will be left in until the blockage is resolved. This may take 2  weeks or longer, depending on the reason for stent implantation. You may have an X-ray exam to make sure your ureter is open and that the stent has not moved out of position (migrated). The stent can be removed by your health care provider in the office. Medicines may be given for comfort while the stent is being removed. Be sure to keep all follow-up appointments so your health care provider can check that you are healing properly. SEEK MEDICAL CARE IF:  You experience increasing pain.  Your pain medicine is not working. SEEK IMMEDIATE MEDICAL CARE IF:  Your urine is dark red or has blood clots.  You are leaking urine (incontinent).  You have a fever, chills, feeling sick to your stomach (nausea), or vomiting.  Your pain is not relieved by pain medicine.  The end of the stent comes out of the urethra.  You are unable to urinate.   This information is not intended to replace advice given to you by your health care provider. Make sure you discuss any questions you have with your health care provider.   Document Released: 07/21/2013 Document Revised: 11/23/2013 Document Reviewed: 06/02/2015 Elsevier Interactive Patient Education 2016 [place]. Ureteral Stent Implantation Ureteral stent implantation is the implantation of a soft plastic tube with multiple holes into the tube that drains urine from your kidney to your bladder (ureter). The stent helps drain your kidney when there is a blockage of the flow of urine in your ureter. The stent has a coil on each end to keep it from falling out. One end stays in the kidney. The other end stays in the bladder. It is most often taken out after any blockage has been removed or your ureter has healed. Short-term stents have a string attached to make removal quite easy. Removal of a short-term stent can be done in your health care provider's office or by you at home. Long-term stents need to be changed every few months. LET [facility] CARE PROVIDER  KNOW ABOUT:  Any allergies you have.  All medicines you are taking, including vitamins, herbs, eye drops, creams, and over-the-counter medicines.  Previous problems you or members of your family have had with the use of anesthetics.  Any blood disorders you have.  Previous surgeries you have had.  Medical conditions you have. RISKS AND COMPLICATIONS Generally, ureteral stent implantation is a safe procedure. However, as with any procedure, complications can occur. Possible complications include:  Movement of the stent away from where it was originally placed (migration). This may affect the ability of the stent to properly drain your kidney. If migration of the stent  occurs, the stent may need to be replaced or repositioned.  Perforation of the ureter.  Infection. BEFORE THE PROCEDURE  You may be asked to wash your genital area with sterile soap the morning of your procedure.  You may be given an oral antibiotic which you should take with a sip of water as prescribed by your health care provider.  You may be asked to not eat or drink for 8 hours before the surgery. PROCEDURE  First you will be given an anesthetic so you do not feel pain during the procedure.  Your health care provider will insert a special lighted instrument called a cystoscope into your bladder. This allows your health care provider to see the opening to your ureter.  A thin wire is carefully threaded into your bladder and up the ureter. The stent is inserted over the wire and the wire is then removed.  Your bladder will be emptied of urine. AFTER THE PROCEDURE You will be taken to a recovery room until it is okay for you to go home.   This information is not intended to replace advice given to you by your health care provider. Make sure you discuss any questions you have with your health care provider.   Document Released: 11/15/2000 Document Revised: 11/23/2013 Document Reviewed: 06/02/2015 Elsevier  Interactive Patient Education 2016 [place] Anesthesia, Adult, Care After Refer to this sheet in the next few weeks. These instructions provide you with information on caring for yourself after your procedure. Your health care provider may also give you more specific instructions. Your treatment has been planned according to current medical practices, but problems sometimes occur. Call your health care provider if you have any problems or questions after your procedure. WHAT TO EXPECT AFTER THE PROCEDURE After the procedure, it is typical to experience:  Sleepiness.  Nausea and vomiting. HOME CARE INSTRUCTIONS  For the first 24 hours after general anesthesia:  Have a responsible person with you.  Do not drive a car. If you are alone, do not take public transportation.  Do not drink alcohol.  Do not take medicine that has not been prescribed by your health care provider.  Do not sign important papers or make important decisions.  You may resume a normal diet and activities as directed by your health care provider.  Change bandages (dressings) as directed.  If you have questions or problems that seem related to general anesthesia, call the hospital and ask for the anesthetist or anesthesiologist on call. SEEK MEDICAL CARE IF:  You have nausea and vomiting that continue the day after anesthesia.  You develop a rash. SEEK IMMEDIATE MEDICAL CARE IF:   You have difficulty breathing.  You have chest pain.  You have any allergic problems.   This information is not intended to replace advice given to you by your health care provider. Make sure you discuss any questions you have with your health care provider.   Document Released: 02/24/2001 Document Revised: 12/09/2014 Document Reviewed: 03/18/2012 Elsevier Interactive Patient Education [employer].

## 2015-11-09 NOTE — Anesthesia Procedure Notes (Signed)
Procedure Name: LMA Insertion Date/Time: 11/09/2015 1:54 PM Performed by: Rosaria Ferries, Mercedees Convery Pre-anesthesia Checklist: Patient identified, Emergency Drugs available, Suction available and Patient being monitored Patient Re-evaluated:Patient Re-evaluated prior to inductionOxygen Delivery Method: Circle system utilized Preoxygenation: Pre-oxygenation with 100% oxygen Intubation Type: IV induction LMA: LMA inserted LMA Size: 4.0 Number of attempts: 1 Tube secured with: Tape Dental Injury: Teeth and Oropharynx as per pre-operative assessment

## 2015-11-10 ENCOUNTER — Encounter: Payer: Self-pay | Admitting: Urology

## 2015-11-10 NOTE — Anesthesia Postprocedure Evaluation (Signed)
Anesthesia Post Note  Patient: Alison Osborne  Procedure(s) Performed: Procedure(s) (LRB): CYSTOSCOPY WITH STENT PLACEMENT (Bilateral)  Patient location during evaluation: PACU Anesthesia Type: General Level of consciousness: awake Pain management: satisfactory to patient Respiratory status: respiratory function stable Cardiovascular status: stable Anesthetic complications: no    Last Vitals:  Filed Vitals:   11/09/15 1537 11/09/15 1619  BP: 109/76 111/71  Pulse: 76 72  Temp: 37.4 C   Resp: 16 18    Last Pain:  Filed Vitals:   11/09/15 1637  PainSc: 0-No pain                 VAN STAVEREN,Alison Osborne

## 2015-11-16 ENCOUNTER — Encounter
Admission: RE | Disposition: A | Discharge status: Home / Self Care | Payer: Self-pay | Source: Ambulatory Visit | Attending: Urology

## 2015-11-16 ENCOUNTER — Encounter: Payer: Self-pay | Admitting: *Deleted

## 2015-11-16 ENCOUNTER — Ambulatory Visit
Admission: RE | Admit: 2015-11-16 | Discharge: 2015-11-16 | Disposition: A | Discharge status: Home / Self Care | Payer: BLUE CROSS/BLUE SHIELD | Source: Ambulatory Visit | Attending: Urology | Admitting: Urology

## 2015-11-16 DIAGNOSIS — Z79899 Other long term (current) drug therapy: Secondary | ICD-10-CM | POA: Insufficient documentation

## 2015-11-16 DIAGNOSIS — N202 Calculus of kidney with calculus of ureter: Secondary | ICD-10-CM | POA: Insufficient documentation

## 2015-11-16 DIAGNOSIS — N200 Calculus of kidney: Secondary | ICD-10-CM

## 2015-11-16 DIAGNOSIS — J309 Allergic rhinitis, unspecified: Secondary | ICD-10-CM | POA: Diagnosis not present

## 2015-11-16 DIAGNOSIS — Z87891 Personal history of nicotine dependence: Secondary | ICD-10-CM | POA: Diagnosis not present

## 2015-11-16 DIAGNOSIS — M1990 Unspecified osteoarthritis, unspecified site: Secondary | ICD-10-CM | POA: Diagnosis not present

## 2015-11-16 DIAGNOSIS — G4700 Insomnia, unspecified: Secondary | ICD-10-CM | POA: Diagnosis not present

## 2015-11-16 HISTORY — PX: EXTRACORPOREAL SHOCK WAVE LITHOTRIPSY: SHX1557

## 2015-11-16 SURGERY — LITHOTRIPSY, ESWL
Anesthesia: Moderate Sedation | Laterality: Left

## 2015-11-16 MED ORDER — FUROSEMIDE 10 MG/ML IJ SOLN
10.0000 mg | Freq: Once | INTRAMUSCULAR | Status: AC
Start: 2015-11-16 — End: 2015-11-16
  Administered 2015-11-16: 10 mg via INTRAVENOUS

## 2015-11-16 MED ORDER — DIPHENHYDRAMINE HCL 25 MG PO CAPS
25.0000 mg | ORAL_CAPSULE | ORAL | Status: AC
  Administered 2015-11-16: 25 mg via ORAL

## 2015-11-16 MED ORDER — MIDAZOLAM HCL 2 MG/2ML IJ SOLN
1.0000 mg | Freq: Once | INTRAMUSCULAR | Status: AC
  Administered 2015-11-16: 1 mg via INTRAMUSCULAR

## 2015-11-16 MED ORDER — DEXTROSE-NACL 5-0.45 % IV SOLN
INTRAVENOUS | Status: DC
  Administered 2015-11-16: 08:00:00 via INTRAVENOUS

## 2015-11-16 MED ORDER — FUROSEMIDE 10 MG/ML IJ SOLN
INTRAMUSCULAR | Status: AC
  Filled 2015-11-16: qty 2

## 2015-11-16 MED ORDER — NUCYNTA 50 MG PO TABS: 50.0000 mg | ORAL_TABLET | Freq: Four times a day (QID) | ORAL | Status: DC | PRN

## 2015-11-16 MED ORDER — PROMETHAZINE HCL 25 MG/ML IJ SOLN
INTRAMUSCULAR | Status: AC
  Administered 2015-11-16: 25 mg via INTRAMUSCULAR
  Filled 2015-11-16: qty 1

## 2015-11-16 MED ORDER — ONDANSETRON 8 MG PO TBDP: 8.0000 mg | ORAL_TABLET | Freq: Four times a day (QID) | ORAL | Status: DC | PRN

## 2015-11-16 MED ORDER — MORPHINE SULFATE (PF) 10 MG/ML IV SOLN
INTRAVENOUS | Status: AC
  Administered 2015-11-16: 10 mg via INTRAMUSCULAR
  Filled 2015-11-16: qty 1

## 2015-11-16 MED ORDER — MIDAZOLAM HCL 2 MG/2ML IJ SOLN
INTRAMUSCULAR | Status: AC
  Administered 2015-11-16: 1 mg via INTRAMUSCULAR
  Filled 2015-11-16: qty 2

## 2015-11-16 MED ORDER — PROMETHAZINE HCL 25 MG/ML IJ SOLN
25.0000 mg | Freq: Once | INTRAMUSCULAR | Status: AC
  Administered 2015-11-16: 25 mg via INTRAMUSCULAR

## 2015-11-16 MED ORDER — DIPHENHYDRAMINE HCL 25 MG PO CAPS
ORAL_CAPSULE | ORAL | Status: AC
  Administered 2015-11-16: 25 mg via ORAL
  Filled 2015-11-16: qty 1

## 2015-11-16 MED ORDER — MORPHINE SULFATE (PF) 10 MG/ML IV SOLN
10.0000 mg | Freq: Once | INTRAVENOUS | Status: AC
  Administered 2015-11-16: 10 mg via INTRAMUSCULAR

## 2015-11-16 NOTE — Discharge Instructions (Addendum)
Dietary Guidelines to Help Prevent Kidney Stones °Your risk of kidney stones can be decreased by adjusting the foods you eat. The most important thing you can do is drink enough fluid. You should drink enough fluid to keep your urine clear or pale yellow. The following guidelines provide specific information for the type of kidney stone you have had. °GUIDELINES ACCORDING TO TYPE OF KIDNEY STONE °Calcium Oxalate Kidney Stones °· Reduce the amount of salt you eat. Foods that have a lot of salt cause your body to release excess calcium into your urine. The excess calcium can combine with a substance called oxalate to form kidney stones. °· Reduce the amount of animal protein you eat if the amount you eat is excessive. Animal protein causes your body to release excess calcium into your urine. Ask your dietitian how much protein from animal sources you should be eating. °· Avoid foods that are high in oxalates. If you take vitamins, they should have less than 500 mg of vitamin C. Your body turns vitamin C into oxalates. You do not need to avoid fruits and vegetables high in vitamin C. °Calcium Phosphate Kidney Stones °· Reduce the amount of salt you eat to help prevent the release of excess calcium into your urine. °· Reduce the amount of animal protein you eat if the amount you eat is excessive. Animal protein causes your body to release excess calcium into your urine. Ask your dietitian how much protein from animal sources you should be eating. °· Get enough calcium from food or take a calcium supplement (ask your dietitian for recommendations). Food sources of calcium that do not increase your risk of kidney stones include: °· Broccoli. °· Dairy products, such as cheese and yogurt. °· Pudding. °Uric Acid Kidney Stones °· Do not have more than 6 oz of animal protein per day. °FOOD SOURCES °Animal Protein Sources °· Meat (all types). °· Poultry. °· Eggs. °· Fish, seafood. °Foods High in Salt °· Salt seasonings. °· Soy  sauce. °· Teriyaki sauce. °· Cured and processed meats. °· Salted crackers and snack foods. °· Fast food. °· Canned soups and most canned foods. °Foods High in Oxalates °· Grains: °· Amaranth. °· Barley. °· Grits. °· Wheat germ. °· Bran. °· Buckwheat flour. °· All bran cereals. °· Pretzels. °· Whole wheat bread. °· Vegetables: °· Beans (wax). °· Beets and beet greens. °· Collard greens. °· Eggplant. °· Escarole. °· Leeks. °· Okra. °· Parsley. °· Rutabagas. °· Spinach. °· Swiss chard. °· Tomato paste. °· Fried potatoes. °· Sweet potatoes. °· Fruits: °· Red currants. °· Figs. °· Kiwi. °· Rhubarb. °· Meat and Other Protein Sources: °· Beans (dried). °· Soy burgers and other soybean products. °· Miso. °· Nuts (peanuts, almonds, pecans, cashews, hazelnuts). °· Nut butters. °· Sesame seeds and tahini (paste made of sesame seeds). °· Poppy seeds. °· Beverages: °· Chocolate drink mixes. °· Soy milk. °· Instant iced tea. °· Juices made from high-oxalate fruits or vegetables. °· Other: °· Carob. °· Chocolate. °· Fruitcake. °· Marmalades. °  °This information is not intended to replace advice given to you by your health care provider. Make sure you discuss any questions you have with your health care provider. °  °Document Released: 03/15/2011 Document Revised: 11/23/2013 Document Reviewed: 10/15/2013 °Elsevier Interactive Patient Education ©2016 [employer] ° °Kidney Stones °Kidney stones (urolithiasis) are deposits that form inside your kidneys. The intense pain is caused by the stone moving through the urinary tract. When the stone moves, the ureter   goes into spasm around the stone. The stone is usually passed in the urine.  °CAUSES  °· A disorder that makes certain neck glands produce too much parathyroid hormone (primary hyperparathyroidism). °· A buildup of uric acid crystals, similar to gout in your joints. °· Narrowing (stricture) of the ureter. °· A kidney obstruction present at birth (congenital  obstruction). °· Previous surgery on the kidney or ureters. °· Numerous kidney infections. °SYMPTOMS  °· Feeling sick to your stomach (nauseous). °· Throwing up (vomiting). °· Blood in the urine (hematuria). °· Pain that usually spreads (radiates) to the groin. °· Frequency or urgency of urination. °DIAGNOSIS  °· Taking a history and physical exam. °· Blood or urine tests. °· CT scan. °· Occasionally, an examination of the inside of the urinary bladder (cystoscopy) is performed. °TREATMENT  °· Observation. °· Increasing your fluid intake. °· Extracorporeal shock wave lithotripsy--This is a noninvasive procedure that uses shock waves to break up kidney stones. °· Surgery may be needed if you have severe pain or persistent obstruction. There are various surgical procedures. Most of the procedures are performed with the use of small instruments. Only small incisions are needed to accommodate these instruments, so recovery time is minimized. °The size, location, and chemical composition are all important variables that will determine the proper choice of action for you. Talk to your health care provider to better understand your situation so that you will minimize the risk of injury to yourself and your kidney.  °HOME CARE INSTRUCTIONS  °· Drink enough water and fluids to keep your urine clear or pale yellow. This will help you to pass the stone or stone fragments. °· Strain all urine through the provided strainer. Keep all particulate matter and stones for your health care provider to see. The stone causing the pain may be as small as a grain of salt. It is very important to use the strainer each and every time you pass your urine. The collection of your stone will allow your health care provider to analyze it and verify that a stone has actually passed. The stone analysis will often identify what you can do to reduce the incidence of recurrences. °· Only take over-the-counter or prescription medicines for pain,  discomfort, or fever as directed by your health care provider. °· Keep all follow-up visits as told by your health care provider. This is important. °· Get follow-up X-rays if required. The absence of pain does not always mean that the stone has passed. It may have only stopped moving. If the urine remains completely obstructed, it can cause loss of kidney function or even complete destruction of the kidney. It is your responsibility to make sure X-rays and follow-ups are completed. Ultrasounds of the kidney can show blockages and the status of the kidney. Ultrasounds are not associated with any radiation and can be performed easily in a matter of minutes. °· Make changes to your daily diet as told by your health care provider. You may be told to: °· Limit the amount of salt that you eat. °· Eat 5 or more servings of fruits and vegetables each day. °· Limit the amount of meat, poultry, fish, and eggs that you eat. °· Collect a 24-hour urine sample as told by your health care provider. You may need to collect another urine sample every 6-12 months. °SEEK MEDICAL CARE IF: °· You experience pain that is progressive and unresponsive to any pain medicine you have been prescribed. °SEEK IMMEDIATE MEDICAL CARE IF:  °· Pain   cannot be controlled with the prescribed medicine. °· You have a fever or shaking chills. °· The severity or intensity of pain increases over 18 hours and is not relieved by pain medicine. °· You develop a new onset of abdominal pain. °· You feel faint or pass out. °· You are unable to urinate. °  °This information is not intended to replace advice given to you by your health care provider. Make sure you discuss any questions you have with your health care provider. °  °Document Released: 11/18/2005 Document Revised: 08/09/2015 Document Reviewed: 04/21/2013 °Elsevier Interactive Patient Education ©2016 Else[employer] Inc. ° °Lithotripsy, Care After °Refer to this sheet in the next few weeks. These instructions  provide you with information on caring for yourself after your procedure. Your health care provider may also give you more specific instructions. Your treatment has been planned according to current medical practices, but problems sometimes occur. Call your health care provider if you have any problems or questions after your procedure. °WHAT TO EXPECT AFTER THE PROCEDURE  °· Your urine may have a red tinge for a few days after treatment. Blood loss is usually minimal. °· You may have soreness in the back or flank area. This usually goes away after a few days. The procedure can cause blotches or bruises on the back where the pressure wave enters the skin. These marks usually cause only minimal discomfort and should disappear in a short time. °· Stone fragments should begin to pass within 24 hours of treatment. However, a delayed passage is not unusual. °· You may have pain, discomfort, and feel sick to your stomach (nauseated) when the crushed fragments of stone are passed down the tube from the kidney to the bladder. Stone fragments can pass soon after the procedure and may last for up to 4-8 weeks. °· A small number of patients may have severe pain when stone fragments are not able to pass, which leads to an obstruction. °· If your stone is greater than 1 inch (2.5 cm) in diameter or if you have multiple stones that have a combined diameter greater than 1 inch (2.5 cm), you may require more than one treatment. °· If you had a stent placed prior to your procedure, you may experience some discomfort, especially during urination. You may experience the pain or discomfort in your flank or back, or you may experience a sharp pain or discomfort at the base of your penis or in your lower abdomen. The discomfort usually lasts only a few minutes after urinating. °HOME CARE INSTRUCTIONS  °· Rest at home until you feel your energy improving. °· Only take over-the-counter or prescription medicines for pain, discomfort, or  fever as directed by your health care provider. Depending on the type of lithotripsy, you may need to take antibiotics and anti-inflammatory medicines for a few days. °· Drink enough water and fluids to keep your urine clear or pale yellow. This helps "flush" your kidneys. It helps pass any remaining pieces of stone and prevents stones from coming back. °· Most people can resume daily activities within 1-2 days after standard lithotripsy. It can take longer to recover from laser and percutaneous lithotripsy. °· Strain all urine through the provided strainer. Keep all particulate matter and stones for your health care provider to see. The stone may be as small as a grain of salt. It is very important to use the strainer each and every time you pass your urine. Any stones that are found can be sent to   a medical lab for examination.  Visit your health care provider for a follow-up appointment in a few weeks. Your doctor may remove your stent if you have one. Your health care provider will also check to see whether stone particles still remain. SEEK MEDICAL CARE IF:   Your pain is not relieved by medicine.  You have a lasting nauseous feeling.  You feel there is too much blood in the urine.  You develop persistent problems with frequent or painful urination that does not at least partially improve after 2 days following the procedure.  You have a congested cough.  You feel lightheaded.  You develop a rash or any other signs that might suggest an allergic problem.  You develop any reaction or side effects to your medicine(s). SEEK IMMEDIATE MEDICAL CARE IF:   You experience severe back or flank pain or both.  You see nothing but blood when you urinate.  You cannot pass any urine at all.  You have a fever or shaking chills.  You develop shortness of breath, difficulty breathing, or chest pain.  You develop vomiting that will not stop after 6-8 hours.  You have a fainting episode.   This  information is not intended to replace advice given to you by your health care provider. Make sure you discuss any questions you have with your health care provider.   Document Released: 12/08/2007 Document Revised: 08/09/2015 Document Reviewed: 06/03/2013 Elsevier Interactive Patient Education [employer].  Lithotripsy Lithotripsy is a treatment that can sometimes help eliminate kidney stones and pain that they cause. A form of lithotripsy, also known as extracorporeal shock wave lithotripsy, is a nonsurgical procedure that helps your body rid itself of the kidney stone when it is too big to pass on its own. Extracorporeal shock wave lithotripsy is a method of crushing a kidney stone with shock waves. These shock waves pass through your body and are focused on your stone. They cause the kidney stones to crumble while still in the urinary tract. It is then easier for the smaller pieces of stone to pass in the urine. Lithotripsy usually takes about an hour. It is done in a hospital, a lithotripsy center, or a mobile unit. It usually does not require an overnight stay. Your health care provider will instruct you on preparation for the procedure. Your health care provider will tell you what to expect afterward. LET [facility] CARE PROVIDER KNOW ABOUT:  Any allergies you have.  All medicines you are taking, including vitamins, herbs, eye drops, creams, and over-the-counter medicines.  Previous problems you or members of your family have had with the use of anesthetics.  Any blood disorders you have.  Previous surgeries you have had.  Medical conditions you have. RISKS AND COMPLICATIONS Generally, lithotripsy for kidney stones is a safe procedure. However, as with any procedure, complications can occur. Possible complications include:  Infection.  Bleeding of the kidney.  Bruising of the kidney or skin.  Obstruction of the ureter.  Failure of the stone to fragment. BEFORE THE  PROCEDURE  Do not eat or drink for 6-8 hours prior to the procedure. You may, however, take the medications with a sip of water that your physician instructs you to take  Do not take aspirin or aspirin-containing products for 7 days prior to your procedure  Do not take nonsteroidal anti-inflammatory products for 7 days prior to your procedure PROCEDURE A stent (flexible tube with holes) may be placed in your ureter. The ureter is  the tube that transports the urine from the kidneys to the bladder. Your health care provider may place a stent before the procedure. This will help keep urine flowing from the kidney if the fragments of the stone block the ureter. You may have an IV tube placed in one of your veins to give you fluids and medicines. These medicines may help you relax or make you sleep. During the procedure, you will lie comfortably on a fluid-filled cushion or in a warm-water bath. After an X-ray or ultrasound exam to locate your stone, shock waves are aimed at the stone. If you are awake, you may feel a tapping sensation as the shock waves pass through your body. If large stone particles remain after treatment, a second procedure may be necessary at a later date. °For comfort during the test: °· Relax as much as possible. °· Try to remain still as much as possible. °· Try to follow instructions to speed up the test. °· Let your health care provider know if you are uncomfortable, anxious, or in pain. °AFTER THE PROCEDURE  °After surgery, you will be taken to the recovery area. A nurse will watch and check your progress. Once you're awake, stable, and taking fluids well, you will be allowed to go home as long as there are no problems. You will also be allowed to pass your urine before discharge. You may be given antibiotics to help prevent infection. You may also be prescribed pain medicine if needed. In a week or two, your health care provider may remove your stent, if you have one. You may first  have an X-ray exam to check on how successful the fragmentation of your stone has been and how much of the stone has passed. Your health care provider will check to see whether or not stone particles remain. °SEEK IMMEDIATE MEDICAL CARE IF: °· You develop a fever or shaking chills. °· Your pain is not relieved by medicine. °· You feel sick to your stomach (nauseated) and you vomit. °· You develop heavy bleeding. °· You have difficulty urinating. °· You start to pass your stent from your penis. °  °This information is not intended to replace advice given to you by your health care provider. Make sure you discuss any questions you have with your health care provider. °  °Document Released: 11/15/2000 Document Revised: 12/09/2014 Document Reviewed: 06/03/2013 °Elsevier Interactive Patient Education ©2016 Elsevier Inc. ° °Renal Colic °Renal colic is pain that is caused by passing a kidney stone. The pain can be sharp and severe. It may be felt in the back, abdomen, side (flank), or groin. It can cause nausea. Renal colic can come and go. °HOME CARE INSTRUCTIONS °Watch your condition for any changes. The following actions may help to lessen any discomfort that you are feeling: °· Take medicines only as directed by your health care provider. °· Ask your health care provider if it is okay to take over-the-counter pain medicine. °· Drink enough fluid to keep your urine clear or pale yellow. Drink 6-8 glasses of water each day. °· Limit the amount of salt that you eat to less than 2 grams per day. °· Reduce the amount of protein in your diet. Eat less meat, fish, nuts, and dairy. °· Avoid foods such as spinach, rhubarb, nuts, or bran. These may make kidney stones more likely to form. °SEEK MEDICAL CARE IF: °· You have a fever or chills. °· Your urine smells bad or looks cloudy. °· You have pain or   burning when you pass urine. SEEK IMMEDIATE MEDICAL CARE IF:  Your flank pain or groin pain suddenly worsens.  You become  confused or disoriented or you lose consciousness.   This information is not intended to replace advice given to you by your health care provider. Make sure you discuss any questions you have with your health care provider.   Document Released: 08/28/2005 Document Revised: 12/09/2014 Document Reviewed: 09/28/2014 Elsevier Interactive Patient Education 2016 [place]   1) The drugs that you were given will stay in your system until tomorrow so for the next 24 hours you should not:  A) Drive an automobile B) Make any legal decisions C) Drink any alcoholic beverage   2) You may resume regular meals tomorrow.  Today it is better to start with liquids and gradually work up to solid foods.  You may eat anything you prefer, but it is better to start with liquids, then soup and crackers, and gradually work up to solid foods.   3) Please notify your doctor immediately if you have any unusual bleeding, trouble breathing, redness and pain at the surgery site, drainage, fever, or pain not relieved by medication. 4)   5) Your post-operative visit with Dr.                                     is: Date:                        Time:    Please call to schedule your post-operative visit.  6) Additional Instructions: 7)

## 2015-11-17 NOTE — Addendum Note (Signed)
Addendum  created 11/17/15 1013 by Molli Barrows, MD   Modules edited: Anesthesia Attestations

## 2015-11-30 ENCOUNTER — Encounter: Payer: Self-pay | Admitting: *Deleted

## 2015-11-30 ENCOUNTER — Ambulatory Visit
Admission: RE | Admit: 2015-11-30 | Discharge: 2015-11-30 | Disposition: A | Discharge status: Home / Self Care | Payer: BLUE CROSS/BLUE SHIELD | Source: Ambulatory Visit | Attending: Urology | Admitting: Urology

## 2015-11-30 ENCOUNTER — Encounter
Admission: RE | Disposition: A | Discharge status: Home / Self Care | Payer: Self-pay | Source: Ambulatory Visit | Attending: Urology

## 2015-11-30 DIAGNOSIS — Z79899 Other long term (current) drug therapy: Secondary | ICD-10-CM | POA: Diagnosis not present

## 2015-11-30 DIAGNOSIS — Z8249 Family history of ischemic heart disease and other diseases of the circulatory system: Secondary | ICD-10-CM | POA: Diagnosis not present

## 2015-11-30 DIAGNOSIS — Z87891 Personal history of nicotine dependence: Secondary | ICD-10-CM | POA: Insufficient documentation

## 2015-11-30 DIAGNOSIS — M1990 Unspecified osteoarthritis, unspecified site: Secondary | ICD-10-CM | POA: Diagnosis not present

## 2015-11-30 DIAGNOSIS — N200 Calculus of kidney: Secondary | ICD-10-CM | POA: Diagnosis present

## 2015-11-30 DIAGNOSIS — J309 Allergic rhinitis, unspecified: Secondary | ICD-10-CM | POA: Diagnosis not present

## 2015-11-30 HISTORY — PX: EXTRACORPOREAL SHOCK WAVE LITHOTRIPSY: SHX1557

## 2015-11-30 SURGERY — LITHOTRIPSY, ESWL
Anesthesia: Moderate Sedation | Laterality: Right

## 2015-11-30 MED ORDER — DEXTROSE-NACL 5-0.45 % IV SOLN
INTRAVENOUS | Status: DC
  Administered 2015-11-30: 07:00:00 via INTRAVENOUS

## 2015-11-30 MED ORDER — MORPHINE SULFATE (PF) 10 MG/ML IV SOLN
10.0000 mg | Freq: Once | INTRAVENOUS | Status: AC
  Administered 2015-11-30: 10 mg via INTRAMUSCULAR

## 2015-11-30 MED ORDER — LEVOFLOXACIN 500 MG PO TABS: 500.0000 mg | ORAL_TABLET | Freq: Every day | ORAL | Status: DC

## 2015-11-30 MED ORDER — NUCYNTA 50 MG PO TABS: 50.0000 mg | ORAL_TABLET | Freq: Four times a day (QID) | ORAL | Status: DC | PRN

## 2015-11-30 MED ORDER — FUROSEMIDE 10 MG/ML IJ SOLN
10.0000 mg | Freq: Once | INTRAMUSCULAR | Status: AC
  Administered 2015-11-30: 10 mg via INTRAVENOUS

## 2015-11-30 MED ORDER — PROMETHAZINE HCL 25 MG/ML IJ SOLN
INTRAMUSCULAR | Status: AC
  Administered 2015-11-30: 25 mg via INTRAMUSCULAR
  Filled 2015-11-30: qty 1

## 2015-11-30 MED ORDER — DEXTROSE 5 % IV SOLN
1.0000 g | Freq: Once | INTRAVENOUS | Status: AC
  Administered 2015-11-30: 1 g via INTRAVENOUS
  Filled 2015-11-30: qty 10

## 2015-11-30 MED ORDER — DIPHENHYDRAMINE HCL 25 MG PO CAPS
25.0000 mg | ORAL_CAPSULE | ORAL | Status: AC
  Administered 2015-11-30: 25 mg via ORAL

## 2015-11-30 MED ORDER — FUROSEMIDE 10 MG/ML IJ SOLN
INTRAMUSCULAR | Status: AC
  Administered 2015-11-30: 10 mg via INTRAVENOUS
  Filled 2015-11-30: qty 2

## 2015-11-30 MED ORDER — DOCUSATE SODIUM 100 MG PO CAPS: 200.0000 mg | ORAL_CAPSULE | Freq: Two times a day (BID) | ORAL | Status: DC

## 2015-11-30 MED ORDER — MIDAZOLAM HCL 2 MG/2ML IJ SOLN
INTRAMUSCULAR | Status: AC
  Administered 2015-11-30: 1 mg via INTRAMUSCULAR
  Filled 2015-11-30: qty 2

## 2015-11-30 MED ORDER — PROMETHAZINE HCL 25 MG/ML IJ SOLN
25.0000 mg | Freq: Once | INTRAMUSCULAR | Status: AC
  Administered 2015-11-30: 25 mg via INTRAMUSCULAR

## 2015-11-30 MED ORDER — DIPHENHYDRAMINE HCL 25 MG PO CAPS
ORAL_CAPSULE | ORAL | Status: AC
Start: 2015-11-30 — End: 2015-11-30
  Administered 2015-11-30: 25 mg via ORAL
  Filled 2015-11-30: qty 1

## 2015-11-30 MED ORDER — TOLTERODINE TARTRATE ER 4 MG PO CP24: 4.0000 mg | ORAL_CAPSULE | Freq: Every day | ORAL | Status: DC

## 2015-11-30 MED ORDER — TAMSULOSIN HCL 0.4 MG PO CAPS: 0.4000 mg | ORAL_CAPSULE | Freq: Every day | ORAL | Status: DC

## 2015-11-30 MED ORDER — MIDAZOLAM HCL 2 MG/2ML IJ SOLN
1.0000 mg | Freq: Once | INTRAMUSCULAR | Status: AC
  Administered 2015-11-30: 1 mg via INTRAMUSCULAR

## 2015-11-30 MED ORDER — MORPHINE SULFATE (PF) 10 MG/ML IV SOLN
INTRAVENOUS | Status: AC
  Administered 2015-11-30: 10 mg via INTRAMUSCULAR
  Filled 2015-11-30: qty 1

## 2015-11-30 NOTE — Discharge Instructions (Addendum)
Dietary Guidelines to Help Prevent Kidney Stones °Your risk of kidney stones can be decreased by adjusting the foods you eat. The most important thing you can do is drink enough fluid. You should drink enough fluid to keep your urine clear or pale yellow. The following guidelines provide specific information for the type of kidney stone you have had. °GUIDELINES ACCORDING TO TYPE OF KIDNEY STONE °Calcium Oxalate Kidney Stones °· Reduce the amount of salt you eat. Foods that have a lot of salt cause your body to release excess calcium into your urine. The excess calcium can combine with a substance called oxalate to form kidney stones. °· Reduce the amount of animal protein you eat if the amount you eat is excessive. Animal protein causes your body to release excess calcium into your urine. Ask your dietitian how much protein from animal sources you should be eating. °· Avoid foods that are high in oxalates. If you take vitamins, they should have less than 500 mg of vitamin C. Your body turns vitamin C into oxalates. You do not need to avoid fruits and vegetables high in vitamin C. °Calcium Phosphate Kidney Stones °· Reduce the amount of salt you eat to help prevent the release of excess calcium into your urine. °· Reduce the amount of animal protein you eat if the amount you eat is excessive. Animal protein causes your body to release excess calcium into your urine. Ask your dietitian how much protein from animal sources you should be eating. °· Get enough calcium from food or take a calcium supplement (ask your dietitian for recommendations). Food sources of calcium that do not increase your risk of kidney stones include: °· Broccoli. °· Dairy products, such as cheese and yogurt. °· Pudding. °Uric Acid Kidney Stones °· Do not have more than 6 oz of animal protein per day. °FOOD SOURCES °Animal Protein Sources °· Meat (all types). °· Poultry. °· Eggs. °· Fish, seafood. °Foods High in Salt °· Salt seasonings. °· Soy  sauce. °· Teriyaki sauce. °· Cured and processed meats. °· Salted crackers and snack foods. °· Fast food. °· Canned soups and most canned foods. °Foods High in Oxalates °· Grains: °· Amaranth. °· Barley. °· Grits. °· Wheat germ. °· Bran. °· Buckwheat flour. °· All bran cereals. °· Pretzels. °· Whole wheat bread. °· Vegetables: °· Beans (wax). °· Beets and beet greens. °· Collard greens. °· Eggplant. °· Escarole. °· Leeks. °· Okra. °· Parsley. °· Rutabagas. °· Spinach. °· Swiss chard. °· Tomato paste. °· Fried potatoes. °· Sweet potatoes. °· Fruits: °· Red currants. °· Figs. °· Kiwi. °· Rhubarb. °· Meat and Other Protein Sources: °· Beans (dried). °· Soy burgers and other soybean products. °· Miso. °· Nuts (peanuts, almonds, pecans, cashews, hazelnuts). °· Nut butters. °· Sesame seeds and tahini (paste made of sesame seeds). °· Poppy seeds. °· Beverages: °· Chocolate drink mixes. °· Soy milk. °· Instant iced tea. °· Juices made from high-oxalate fruits or vegetables. °· Other: °· Carob. °· Chocolate. °· Fruitcake. °· Marmalades. °  °This information is not intended to replace advice given to you by your health care provider. Make sure you discuss any questions you have with your health care provider. °  °Document Released: 03/15/2011 Document Revised: 11/23/2013 Document Reviewed: 10/15/2013 °Elsevier Interactive Patient Education ©2016 [employer] ° °Kidney Stones °Kidney stones (urolithiasis) are deposits that form inside your kidneys. The intense pain is caused by the stone moving through the urinary tract. When the stone moves, the ureter   goes into spasm around the stone. The stone is usually passed in the urine.  °CAUSES  °· A disorder that makes certain neck glands produce too much parathyroid hormone (primary hyperparathyroidism). °· A buildup of uric acid crystals, similar to gout in your joints. °· Narrowing (stricture) of the ureter. °· A kidney obstruction present at birth (congenital  obstruction). °· Previous surgery on the kidney or ureters. °· Numerous kidney infections. °SYMPTOMS  °· Feeling sick to your stomach (nauseous). °· Throwing up (vomiting). °· Blood in the urine (hematuria). °· Pain that usually spreads (radiates) to the groin. °· Frequency or urgency of urination. °DIAGNOSIS  °· Taking a history and physical exam. °· Blood or urine tests. °· CT scan. °· Occasionally, an examination of the inside of the urinary bladder (cystoscopy) is performed. °TREATMENT  °· Observation. °· Increasing your fluid intake. °· Extracorporeal shock wave lithotripsy--This is a noninvasive procedure that uses shock waves to break up kidney stones. °· Surgery may be needed if you have severe pain or persistent obstruction. There are various surgical procedures. Most of the procedures are performed with the use of small instruments. Only small incisions are needed to accommodate these instruments, so recovery time is minimized. °The size, location, and chemical composition are all important variables that will determine the proper choice of action for you. Talk to your health care provider to better understand your situation so that you will minimize the risk of injury to yourself and your kidney.  °HOME CARE INSTRUCTIONS  °· Drink enough water and fluids to keep your urine clear or pale yellow. This will help you to pass the stone or stone fragments. °· Strain all urine through the provided strainer. Keep all particulate matter and stones for your health care provider to see. The stone causing the pain may be as small as a grain of salt. It is very important to use the strainer each and every time you pass your urine. The collection of your stone will allow your health care provider to analyze it and verify that a stone has actually passed. The stone analysis will often identify what you can do to reduce the incidence of recurrences. °· Only take over-the-counter or prescription medicines for pain,  discomfort, or fever as directed by your health care provider. °· Keep all follow-up visits as told by your health care provider. This is important. °· Get follow-up X-rays if required. The absence of pain does not always mean that the stone has passed. It may have only stopped moving. If the urine remains completely obstructed, it can cause loss of kidney function or even complete destruction of the kidney. It is your responsibility to make sure X-rays and follow-ups are completed. Ultrasounds of the kidney can show blockages and the status of the kidney. Ultrasounds are not associated with any radiation and can be performed easily in a matter of minutes. °· Make changes to your daily diet as told by your health care provider. You may be told to: °¨ Limit the amount of salt that you eat. °¨ Eat 5 or more servings of fruits and vegetables each day. °¨ Limit the amount of meat, poultry, fish, and eggs that you eat. °· Collect a 24-hour urine sample as told by your health care provider. You may need to collect another urine sample every 6-12 months. °SEEK MEDICAL CARE IF: °· You experience pain that is progressive and unresponsive to any pain medicine you have been prescribed. °SEEK IMMEDIATE MEDICAL CARE IF:  °· Pain   cannot be controlled with the prescribed medicine. °· You have a fever or shaking chills. °· The severity or intensity of pain increases over 18 hours and is not relieved by pain medicine. °· You develop a new onset of abdominal pain. °· You feel faint or pass out. °· You are unable to urinate. °  °This information is not intended to replace advice given to you by your health care provider. Make sure you discuss any questions you have with your health care provider. °  °Document Released: 11/18/2005 Document Revised: 08/09/2015 Document Reviewed: 04/21/2013 °Elsevier Interactive Patient Education ©2016 [employer] ° °Lithotripsy, Care After °Refer to this sheet in the next few weeks. These instructions  provide you with information on caring for yourself after your procedure. Your health care provider may also give you more specific instructions. Your treatment has been planned according to current medical practices, but problems sometimes occur. Call your health care provider if you have any problems or questions after your procedure. °WHAT TO EXPECT AFTER THE PROCEDURE  °· Your urine may have a red tinge for a few days after treatment. Blood loss is usually minimal. °· You may have soreness in the back or flank area. This usually goes away after a few days. The procedure can cause blotches or bruises on the back where the pressure wave enters the skin. These marks usually cause only minimal discomfort and should disappear in a short time. °· Stone fragments should begin to pass within 24 hours of treatment. However, a delayed passage is not unusual. °· You may have pain, discomfort, and feel sick to your stomach (nauseated) when the crushed fragments of stone are passed down the tube from the kidney to the bladder. Stone fragments can pass soon after the procedure and may last for up to 4-8 weeks. °· A small number of patients may have severe pain when stone fragments are not able to pass, which leads to an obstruction. °· If your stone is greater than 1 inch (2.5 cm) in diameter or if you have multiple stones that have a combined diameter greater than 1 inch (2.5 cm), you may require more than one treatment. °· If you had a stent placed prior to your procedure, you may experience some discomfort, especially during urination. You may experience the pain or discomfort in your flank or back, or you may experience a sharp pain or discomfort at the base of your penis or in your lower abdomen. The discomfort usually lasts only a few minutes after urinating. °HOME CARE INSTRUCTIONS  °· Rest at home until you feel your energy improving. °· Only take over-the-counter or prescription medicines for pain, discomfort, or  fever as directed by your health care provider. Depending on the type of lithotripsy, you may need to take antibiotics and anti-inflammatory medicines for a few days. °· Drink enough water and fluids to keep your urine clear or pale yellow. This helps "flush" your kidneys. It helps pass any remaining pieces of stone and prevents stones from coming back. °· Most people can resume daily activities within 1-2 days after standard lithotripsy. It can take longer to recover from laser and percutaneous lithotripsy. °· Strain all urine through the provided strainer. Keep all particulate matter and stones for your health care provider to see. The stone may be as small as a grain of salt. It is very important to use the strainer each and every time you pass your urine. Any stones that are found can be sent to   a medical lab for examination.  Visit your health care provider for a follow-up appointment in a few weeks. Your doctor may remove your stent if you have one. Your health care provider will also check to see whether stone particles still remain. SEEK MEDICAL CARE IF:   Your pain is not relieved by medicine.  You have a lasting nauseous feeling.  You feel there is too much blood in the urine.  You develop persistent problems with frequent or painful urination that does not at least partially improve after 2 days following the procedure.  You have a congested cough.  You feel lightheaded.  You develop a rash or any other signs that might suggest an allergic problem.  You develop any reaction or side effects to your medicine(s). SEEK IMMEDIATE MEDICAL CARE IF:   You experience severe back or flank pain or both.  You see nothing but blood when you urinate.  You cannot pass any urine at all.  You have a fever or shaking chills.  You develop shortness of breath, difficulty breathing, or chest pain.  You develop vomiting that will not stop after 6-8 hours.  You have a fainting episode.   This  information is not intended to replace advice given to you by your health care provider. Make sure you discuss any questions you have with your health care provider.   Document Released: 12/08/2007 Document Revised: 08/09/2015 Document Reviewed: 06/03/2013 Elsevier Interactive Patient Education 2016 [place].  Renal Colic Renal colic is pain that is caused by passing a kidney stone. The pain can be sharp and severe. It may be felt in the back, abdomen, side (flank), or groin. It can cause nausea. Renal colic can come and go. HOME CARE INSTRUCTIONS Watch your condition for any changes. The following actions may help to lessen any discomfort that you are feeling:  Take medicines only as directed by your health care provider.  Ask your health care provider if it is okay to take over-the-counter pain medicine.  Drink enough fluid to keep your urine clear or pale yellow. Drink 6-8 glasses of water each day.  Limit the amount of salt that you eat to less than 2 grams per day.  Reduce the amount of protein in your diet. Eat less meat, fish, nuts, and dairy.  Avoid foods such as spinach, rhubarb, nuts, or bran. These may make kidney stones more likely to form. SEEK MEDICAL CARE IF:  You have a fever or chills.  Your urine smells bad or looks cloudy.  You have pain or burning when you pass urine. SEEK IMMEDIATE MEDICAL CARE IF:  Your flank pain or groin pain suddenly worsens.  You become confused or disoriented or you lose consciousness.   This information is not intended to replace advice given to you by your health care provider. Make sure you discuss any questions you have with your health care provider.   Document Released: 08/28/2005 Document Revised: 12/09/2014 Document Reviewed: 09/28/2014 Elsevier Interactive Patient Education [employer].

## 2015-12-10 NOTE — Addendum Note (Signed)
Addendum  created 12/10/15 1340 by Molli Barrows, MD   Modules edited: Anesthesia Responsible Staff

## 2016-01-04 ENCOUNTER — Encounter: Payer: Self-pay | Admitting: *Deleted

## 2016-01-04 ENCOUNTER — Encounter
Admission: RE | Disposition: A | Discharge status: Home / Self Care | Payer: Self-pay | Source: Ambulatory Visit | Attending: Urology

## 2016-01-04 ENCOUNTER — Ambulatory Visit
Admission: RE | Admit: 2016-01-04 | Discharge: 2016-01-04 | Disposition: A | Discharge status: Home / Self Care | Payer: BLUE CROSS/BLUE SHIELD | Source: Ambulatory Visit | Attending: Urology | Admitting: Urology

## 2016-01-04 DIAGNOSIS — N200 Calculus of kidney: Secondary | ICD-10-CM | POA: Insufficient documentation

## 2016-01-04 HISTORY — PX: EXTRACORPOREAL SHOCK WAVE LITHOTRIPSY: SHX1557

## 2016-01-04 SURGERY — LITHOTRIPSY, ESWL
Anesthesia: Moderate Sedation | Laterality: Left

## 2016-01-04 MED ORDER — MIDAZOLAM HCL 2 MG/2ML IJ SOLN
INTRAMUSCULAR | Status: AC
  Filled 2016-01-04: qty 2

## 2016-01-04 MED ORDER — PROMETHAZINE HCL 25 MG/ML IJ SOLN
INTRAMUSCULAR | Status: AC
  Filled 2016-01-04: qty 1

## 2016-01-04 MED ORDER — DEXTROSE 5 % IV SOLN
1.0000 g | Freq: Once | INTRAVENOUS | Status: AC
  Administered 2016-01-04: 1 g via INTRAVENOUS
  Filled 2016-01-04: qty 10

## 2016-01-04 MED ORDER — DIPHENHYDRAMINE HCL 25 MG PO CAPS
ORAL_CAPSULE | ORAL | Status: AC
  Filled 2016-01-04: qty 1

## 2016-01-04 MED ORDER — FUROSEMIDE 10 MG/ML IJ SOLN: 10.0000 mg | Freq: Once | INTRAMUSCULAR | Status: DC

## 2016-01-04 MED ORDER — LEVOFLOXACIN 500 MG PO TABS: 500.0000 mg | ORAL_TABLET | Freq: Every day | ORAL | Status: DC

## 2016-01-04 MED ORDER — FUROSEMIDE 10 MG/ML IJ SOLN
INTRAMUSCULAR | Status: AC
  Administered 2016-01-04: 10 mg
  Filled 2016-01-04: qty 2

## 2016-01-04 MED ORDER — DIPHENHYDRAMINE HCL 25 MG PO CAPS
25.0000 mg | ORAL_CAPSULE | ORAL | Status: AC
  Administered 2016-01-04: 25 mg via ORAL

## 2016-01-04 MED ORDER — MORPHINE SULFATE (PF) 10 MG/ML IV SOLN
INTRAVENOUS | Status: AC
  Filled 2016-01-04: qty 1

## 2016-01-04 MED ORDER — PROMETHAZINE HCL 25 MG/ML IJ SOLN
25.0000 mg | Freq: Once | INTRAMUSCULAR | Status: AC
  Administered 2016-01-04: 25 mg via INTRAMUSCULAR

## 2016-01-04 MED ORDER — MORPHINE SULFATE (PF) 10 MG/ML IV SOLN
10.0000 mg | Freq: Once | INTRAVENOUS | Status: AC
  Administered 2016-01-04: 10 mg via INTRAMUSCULAR

## 2016-01-04 MED ORDER — MIDAZOLAM HCL 2 MG/2ML IJ SOLN
1.0000 mg | Freq: Once | INTRAMUSCULAR | Status: AC
  Administered 2016-01-04: 1 mg via INTRAMUSCULAR

## 2016-01-04 NOTE — Discharge Instructions (Addendum)
Dietary Guidelines to Help Prevent Kidney Stones °Your risk of kidney stones can be decreased by adjusting the foods you eat. The most important thing you can do is drink enough fluid. You should drink enough fluid to keep your urine clear or pale yellow. The following guidelines provide specific information for the type of kidney stone you have had. °GUIDELINES ACCORDING TO TYPE OF KIDNEY STONE °Calcium Oxalate Kidney Stones °· Reduce the amount of salt you eat. Foods that have a lot of salt cause your body to release excess calcium into your urine. The excess calcium can combine with a substance called oxalate to form kidney stones. °· Reduce the amount of animal protein you eat if the amount you eat is excessive. Animal protein causes your body to release excess calcium into your urine. Ask your dietitian how much protein from animal sources you should be eating. °· Avoid foods that are high in oxalates. If you take vitamins, they should have less than 500 mg of vitamin C. Your body turns vitamin C into oxalates. You do not need to avoid fruits and vegetables high in vitamin C. °Calcium Phosphate Kidney Stones °· Reduce the amount of salt you eat to help prevent the release of excess calcium into your urine. °· Reduce the amount of animal protein you eat if the amount you eat is excessive. Animal protein causes your body to release excess calcium into your urine. Ask your dietitian how much protein from animal sources you should be eating. °· Get enough calcium from food or take a calcium supplement (ask your dietitian for recommendations). Food sources of calcium that do not increase your risk of kidney stones include: °· Broccoli. °· Dairy products, such as cheese and yogurt. °· Pudding. °Uric Acid Kidney Stones °· Do not have more than 6 oz of animal protein per day. °FOOD SOURCES °Animal Protein Sources °· Meat (all types). °· Poultry. °· Eggs. °· Fish, seafood. °Foods High in Salt °· Salt seasonings. °· Soy  sauce. °· Teriyaki sauce. °· Cured and processed meats. °· Salted crackers and snack foods. °· Fast food. °· Canned soups and most canned foods. °Foods High in Oxalates °· Grains: °· Amaranth. °· Barley. °· Grits. °· Wheat germ. °· Bran. °· Buckwheat flour. °· All bran cereals. °· Pretzels. °· Whole wheat bread. °· Vegetables: °· Beans (wax). °· Beets and beet greens. °· Collard greens. °· Eggplant. °· Escarole. °· Leeks. °· Okra. °· Parsley. °· Rutabagas. °· Spinach. °· Swiss chard. °· Tomato paste. °· Fried potatoes. °· Sweet potatoes. °· Fruits: °· Red currants. °· Figs. °· Kiwi. °· Rhubarb. °· Meat and Other Protein Sources: °· Beans (dried). °· Soy burgers and other soybean products. °· Miso. °· Nuts (peanuts, almonds, pecans, cashews, hazelnuts). °· Nut butters. °· Sesame seeds and tahini (paste made of sesame seeds). °· Poppy seeds. °· Beverages: °· Chocolate drink mixes. °· Soy milk. °· Instant iced tea. °· Juices made from high-oxalate fruits or vegetables. °· Other: °· Carob. °· Chocolate. °· Fruitcake. °· Marmalades. °  °This information is not intended to replace advice given to you by your health care provider. Make sure you discuss any questions you have with your health care provider. °  °Document Released: 03/15/2011 Document Revised: 11/23/2013 Document Reviewed: 10/15/2013 °Elsevier Interactive Patient Education ©2016 Else[employer] Inc[employer] ° °Kidney Stones °Kidney stones (urolithiasis) are deposits that form inside your kidneys. The intense pain is caused by the stone moving through the urinary tract. When the stone moves, the ureter   goes into spasm around the stone. The stone is usually passed in the urine.  °CAUSES  °· A disorder that makes certain neck glands produce too much parathyroid hormone (primary hyperparathyroidism). °· A buildup of uric acid crystals, similar to gout in your joints. °· Narrowing (stricture) of the ureter. °· A kidney obstruction present at birth (congenital  obstruction). °· Previous surgery on the kidney or ureters. °· Numerous kidney infections. °SYMPTOMS  °· Feeling sick to your stomach (nauseous). °· Throwing up (vomiting). °· Blood in the urine (hematuria). °· Pain that usually spreads (radiates) to the groin. °· Frequency or urgency of urination. °DIAGNOSIS  °· Taking a history and physical exam. °· Blood or urine tests. °· CT scan. °· Occasionally, an examination of the inside of the urinary bladder (cystoscopy) is performed. °TREATMENT  °· Observation. °· Increasing your fluid intake. °· Extracorporeal shock wave lithotripsy--This is a noninvasive procedure that uses shock waves to break up kidney stones. °· Surgery may be needed if you have severe pain or persistent obstruction. There are various surgical procedures. Most of the procedures are performed with the use of small instruments. Only small incisions are needed to accommodate these instruments, so recovery time is minimized. °The size, location, and chemical composition are all important variables that will determine the proper choice of action for you. Talk to your health care provider to better understand your situation so that you will minimize the risk of injury to yourself and your kidney.  °HOME CARE INSTRUCTIONS  °· Drink enough water and fluids to keep your urine clear or pale yellow. This will help you to pass the stone or stone fragments. °· Strain all urine through the provided strainer. Keep all particulate matter and stones for your health care provider to see. The stone causing the pain may be as small as a grain of salt. It is very important to use the strainer each and every time you pass your urine. The collection of your stone will allow your health care provider to analyze it and verify that a stone has actually passed. The stone analysis will often identify what you can do to reduce the incidence of recurrences. °· Only take over-the-counter or prescription medicines for pain,  discomfort, or fever as directed by your health care provider. °· Keep all follow-up visits as told by your health care provider. This is important. °· Get follow-up X-rays if required. The absence of pain does not always mean that the stone has passed. It may have only stopped moving. If the urine remains completely obstructed, it can cause loss of kidney function or even complete destruction of the kidney. It is your responsibility to make sure X-rays and follow-ups are completed. Ultrasounds of the kidney can show blockages and the status of the kidney. Ultrasounds are not associated with any radiation and can be performed easily in a matter of minutes. °· Make changes to your daily diet as told by your health care provider. You may be told to: °· Limit the amount of salt that you eat. °· Eat 5 or more servings of fruits and vegetables each day. °· Limit the amount of meat, poultry, fish, and eggs that you eat. °· Collect a 24-hour urine sample as told by your health care provider. You may need to collect another urine sample every 6-12 months. °SEEK MEDICAL CARE IF: °· You experience pain that is progressive and unresponsive to any pain medicine you have been prescribed. °SEEK IMMEDIATE MEDICAL CARE IF:  °· Pain   cannot be controlled with the prescribed medicine. °· You have a fever or shaking chills. °· The severity or intensity of pain increases over 18 hours and is not relieved by pain medicine. °· You develop a new onset of abdominal pain. °· You feel faint or pass out. °· You are unable to urinate. °  °This information is not intended to replace advice given to you by your health care provider. Make sure you discuss any questions you have with your health care provider. °  °Document Released: 11/18/2005 Document Revised: 08/09/2015 Document Reviewed: 04/21/2013 °Elsevier Interactive Patient Education ©2016 Elsevier Inc. ° °Lithotripsy, Care After °Refer to this sheet in the next few weeks. These instructions  provide you with information on caring for yourself after your procedure. Your health care provider may also give you more specific instructions. Your treatment has been planned according to current medical practices, but problems sometimes occur. Call your health care provider if you have any problems or questions after your procedure. °WHAT TO EXPECT AFTER THE PROCEDURE  °· Your urine may have a red tinge for a few days after treatment. Blood loss is usually minimal. °· You may have soreness in the back or flank area. This usually goes away after a few days. The procedure can cause blotches or bruises on the back where the pressure wave enters the skin. These marks usually cause only minimal discomfort and should disappear in a short time. °· Stone fragments should begin to pass within 24 hours of treatment. However, a delayed passage is not unusual. °· You may have pain, discomfort, and feel sick to your stomach (nauseated) when the crushed fragments of stone are passed down the tube from the kidney to the bladder. Stone fragments can pass soon after the procedure and may last for up to 4-8 weeks. °· A small number of patients may have severe pain when stone fragments are not able to pass, which leads to an obstruction. °· If your stone is greater than 1 inch (2.5 cm) in diameter or if you have multiple stones that have a combined diameter greater than 1 inch (2.5 cm), you may require more than one treatment. °· If you had a stent placed prior to your procedure, you may experience some discomfort, especially during urination. You may experience the pain or discomfort in your flank or back, or you may experience a sharp pain or discomfort at the base of your penis or in your lower abdomen. The discomfort usually lasts only a few minutes after urinating. °HOME CARE INSTRUCTIONS  °· Rest at home until you feel your energy improving. °· Only take over-the-counter or prescription medicines for pain, discomfort, or  fever as directed by your health care provider. Depending on the type of lithotripsy, you may need to take antibiotics and anti-inflammatory medicines for a few days. °· Drink enough water and fluids to keep your urine clear or pale yellow. This helps "flush" your kidneys. It helps pass any remaining pieces of stone and prevents stones from coming back. °· Most people can resume daily activities within 1-2 days after standard lithotripsy. It can take longer to recover from laser and percutaneous lithotripsy. °· Strain all urine through the provided strainer. Keep all particulate matter and stones for your health care provider to see. The stone may be as small as a grain of salt. It is very important to use the strainer each and every time you pass your urine. Any stones that are found can be sent to   a medical lab for examination.  Visit your health care provider for a follow-up appointment in a few weeks. Your doctor may remove your stent if you have one. Your health care provider will also check to see whether stone particles still remain. SEEK MEDICAL CARE IF:   Your pain is not relieved by medicine.  You have a lasting nauseous feeling.  You feel there is too much blood in the urine.  You develop persistent problems with frequent or painful urination that does not at least partially improve after 2 days following the procedure.  You have a congested cough.  You feel lightheaded.  You develop a rash or any other signs that might suggest an allergic problem.  You develop any reaction or side effects to your medicine(s). SEEK IMMEDIATE MEDICAL CARE IF:   You experience severe back or flank pain or both.  You see nothing but blood when you urinate.  You cannot pass any urine at all.  You have a fever or shaking chills.  You develop shortness of breath, difficulty breathing, or chest pain.  You develop vomiting that will not stop after 6-8 hours.  You have a fainting episode.   This  information is not intended to replace advice given to you by your health care provider. Make sure you discuss any questions you have with your health care provider.   Document Released: 12/08/2007 Document Revised: 08/09/2015 Document Reviewed: 06/03/2013 Elsevier Interactive Patient Education 2016 [employer].  Renal Colic Renal colic is pain that is caused by passing a kidney stone. The pain can be sharp and severe. It may be felt in the back, abdomen, side (flank), or groin. It can cause nausea. Renal colic can come and go. HOME CARE INSTRUCTIONS Watch your condition for any changes. The following actions may help to lessen any discomfort that you are feeling:  Take medicines only as directed by your health care provider.  Ask your health care provider if it is okay to take over-the-counter pain medicine.  Drink enough fluid to keep your urine clear or pale yellow. Drink 6-8 glasses of water each day.  Limit the amount of salt that you eat to less than 2 grams per day.  Reduce the amount of protein in your diet. Eat less meat, fish, nuts, and dairy.  Avoid foods such as spinach, rhubarb, nuts, or bran. These may make kidney stones more likely to form. SEEK MEDICAL CARE IF:  You have a fever or chills.  Your urine smells bad or looks cloudy.  You have pain or burning when you pass urine. SEEK IMMEDIATE MEDICAL CARE IF:  Your flank pain or groin pain suddenly worsens.  You become confused or disoriented or you lose consciousness.   This information is not intended to replace advice given to you by your health care provider. Make sure you discuss any questions you have with your health care provider.   Document Released: 08/28/2005 Document Revised: 12/09/2014 Document Reviewed: 09/28/2014 Elsevier Interactive Patient Education [employer].  Lithotripsy Lithotripsy is a treatment that can sometimes help eliminate kidney stones and pain that they cause. A form of  lithotripsy, also known as extracorporeal shock wave lithotripsy, is a nonsurgical procedure that helps your body rid itself of the kidney stone when it is too big to pass on its own. Extracorporeal shock wave lithotripsy is a method of crushing a kidney stone with shock waves. These shock waves pass through your body and are focused on your stone. They cause  the kidney stones to crumble while still in the urinary tract. It is then easier for the smaller pieces of stone to pass in the urine. Lithotripsy usually takes about an hour. It is done in a hospital, a lithotripsy center, or a mobile unit. It usually does not require an overnight stay. Your health care provider will instruct you on preparation for the procedure. Your health care provider will tell you what to expect afterward. LET [facility] CARE PROVIDER KNOW ABOUT:  Any allergies you have.  All medicines you are taking, including vitamins, herbs, eye drops, creams, and over-the-counter medicines.  Previous problems you or members of your family have had with the use of anesthetics.  Any blood disorders you have.  Previous surgeries you have had.  Medical conditions you have. RISKS AND COMPLICATIONS Generally, lithotripsy for kidney stones is a safe procedure. However, as with any procedure, complications can occur. Possible complications include:  Infection.  Bleeding of the kidney.  Bruising of the kidney or skin.  Obstruction of the ureter.  Failure of the stone to fragment. BEFORE THE PROCEDURE  Do not eat or drink for 6-8 hours prior to the procedure. You may, however, take the medications with a sip of water that your physician instructs you to take  Do not take aspirin or aspirin-containing products for 7 days prior to your procedure  Do not take nonsteroidal anti-inflammatory products for 7 days prior to your procedure PROCEDURE A stent (flexible tube with holes) may be placed in your ureter. The ureter is the tube  that transports the urine from the kidneys to the bladder. Your health care provider may place a stent before the procedure. This will help keep urine flowing from the kidney if the fragments of the stone block the ureter. You may have an IV tube placed in one of your veins to give you fluids and medicines. These medicines may help you relax or make you sleep. During the procedure, you will lie comfortably on a fluid-filled cushion or in a warm-water bath. After an X-ray or ultrasound exam to locate your stone, shock waves are aimed at the stone. If you are awake, you may feel a tapping sensation as the shock waves pass through your body. If large stone particles remain after treatment, a second procedure may be necessary at a later date. For comfort during the test:  Relax as much as possible.  Try to remain still as much as possible.  Try to follow instructions to speed up the test.  Let your health care provider know if you are uncomfortable, anxious, or in pain. AFTER THE PROCEDURE  After surgery, you will be taken to the recovery area. A nurse will watch and check your progress. Once you're awake, stable, and taking fluids well, you will be allowed to go home as long as there are no problems. You will also be allowed to pass your urine before discharge.You may be given antibiotics to help prevent infection. You may also be prescribed pain medicine if needed. In a week or two, your health care provider may remove your stent, if you have one. You may first have an X-ray exam to check on how successful the fragmentation of your stone has been and how much of the stone has passed. Your health care provider will check to see whether or not stone particles remain. SEEK IMMEDIATE MEDICAL CARE IF:  You develop a fever or shaking chills.  Your pain is not relieved by medicine.  You  feel sick to your stomach (nauseated) and you vomit.  You develop heavy bleeding.  You have difficulty  urinating.  You start to pass your stent from your penis.   This information is not intended to replace advice given to you by your health care provider. Make sure you discuss any questions you have with your health care provider.   Document Released: 11/15/2000 Document Revised: 12/09/2014 Document Reviewed: 06/03/2013 Elsevier Interactive Patient Education 2016 [place]   1) The drugs that you were given will stay in your system until tomorrow so for the next 24 hours you should not:  A) Drive an automobile B) Make any legal decisions C) Drink any alcoholic beverage   2) You may resume regular meals tomorrow.  Today it is better to start with liquids and gradually work up to solid foods.  You may eat anything you prefer, but it is better to start with liquids, then soup and crackers, and gradually work up to solid foods.   3) Please notify your doctor immediately if you have any unusual bleeding, trouble breathing, redness and pain at the surgery site, drainage, fever, or pain not relieved by medication. 4)   5) Your post-operative visit with Dr.                                     is: Date:                        Time:    Please call to schedule your post-operative visit.  6) Additional Instructions: 7)

## 2016-01-12 NOTE — H&P (Signed)
Alison Osborne, ALCIVAR               ACCOUNT NO.:  [id number]  MEDICAL RECORD NO.:  [id number]  LOCATION:                               FACILITY:  [facility]  PHYSICIAN:  Maryan Puls          DATE OF BIRTH:  Nov 03, 1961  DATE OF ADMISSION:  01/16/2016 DATE OF DISCHARGE:                            HISTORY AND PHYSICAL   SAME-DAY SURGERY:  January 16, 2016.  CHIEF COMPLAINT:  Kidney stones.  HISTORY OF PRESENT ILLNESS:  Mrs. Dennard is a 55 year old Caucasian female, who presented with bilateral renal calculi and left ureteral calculus.  She underwent bilateral stent placement on December 8. Subsequent to that, she has had lithotripsy of all the stones and has remaining small stone burden consisting of fragments only.  She comes in now for cystoscopy with bilateral stent retrieval.  PAST MEDICAL HISTORY:  No drug allergies.  CURRENT MEDICATIONS:  Effexor, Nasacort, ibuprofen, and hydrocodone.  PREVIOUS SURGICAL PROCEDURES:  Include C-section in 1993, ESWL in December 2016.  SOCIAL HISTORY:  Patient quit smoking in 2015 with a 22 pack year history.  She consumes 8 alcoholic beverages per week.  FAMILY HISTORY:  Mother died of stroke at age 51, father died age 70 of COPD.  Brother is living at age 64 with heart disease.  PAST AND CURRENT MEDICAL DECISIONS: 1. Recurrent kidney stone disease. 2. Arthritis. 3. Insomnia. 4. Allergic rhinitis.  REVIEW OF SYSTEMS:  The patient denied chest pain, shortness of breath, diabetes, stroke, or hypertension.  PHYSICAL EXAMINATION:  GENERAL:  Well-nourished white female, in no acute distress. HEENT:  Sclerae were clear.  Pupils were equally round, reactive to light and accommodation.  Extraocular movements are intact. NECK:  Supple.  No palpable cervical adenopathy. LUNGS:  Clear to auscultation. CARDIOVASCULAR:  Regular rhythm and rate without audible murmurs. ABDOMEN:  Soft, nontender abdomen. GU AND RECTAL:  Deferred.  IMPRESSION:   Bilateral nephrolithiasis and urolithiasis, status post ESWL and stent placement.  PLAN:  Cystoscopy with bilateral stent retrieval.          ______________________________ Maryan Puls     MW/MEDQ  D:  01/11/2016  T:  01/11/2016  Job:  [id number]

## 2016-01-15 ENCOUNTER — Other Ambulatory Visit: Payer: BLUE CROSS/BLUE SHIELD

## 2016-01-15 ENCOUNTER — Encounter: Payer: Self-pay | Admitting: *Deleted

## 2016-01-15 NOTE — Patient Instructions (Signed)
  Your procedure is scheduled on: 01-16-16  Report to [facility] To find out your arrival time please call [phone] between 1PM - 3PM on 01-15-16  Remember: Instructions that are not followed completely may result in serious medical risk, up to and including death, or upon the discretion of your surgeon and anesthesiologist your surgery may need to be rescheduled.    _X___ 1. Do not eat food or drink liquids after midnight. No gum chewing or hard candies.     _X___ 2. No Alcohol for 24 hours before or after surgery.   ____ 3. Bring all medications with you on the day of surgery if instructed.    _X___ 4. Notify your doctor if there is any change in your medical condition     (cold, fever, infections).     Do not wear jewelry, make-up, hairpins, clips or nail polish.  Do not wear lotions, powders, or perfumes. You may wear deodorant.  Do not shave 48 hours prior to surgery. Men may shave face and neck.  Do not bring valuables to the hospital.    [facility] is not responsible for any belongings or valuables.               Contacts, dentures or bridgework may not be worn into surgery.  Leave your suitcase in the car. After surgery it may be brought to your room.  For patients admitted to the hospital, discharge time is determined by your treatment team.   Patients discharged the day of surgery will not be allowed to drive home.   Please read over the following fact sheets that you were given:      _X___ Take these medicines the morning of surgery with A SIP OF WATER:    1. FLOMAX  2. EFFEXOR  3.   4.  5.  6.  ____ Fleet Enema (as directed)   ____ Use CHG Soap as directed  ____ Use inhalers on the day of surgery  ____ Stop metformin 2 days prior to surgery    ____ Take 1/2 of usual insulin dose the night before surgery and none on the morning of surgery.   ____ Stop Coumadin/Plavix/aspirin-N/A  ____ Stop Anti-inflammatories-NO NSAIDS OR  ASA PRODUCTS-TYLENOL OK TO TAKE   ____ Stop supplements until after surgery.    ____ Bring C-Pap to the hospital.

## 2016-01-16 ENCOUNTER — Encounter
Admission: RE | Disposition: A | Discharge status: Home / Self Care | Payer: Self-pay | Source: Ambulatory Visit | Attending: Urology

## 2016-01-16 ENCOUNTER — Ambulatory Visit: Payer: BLUE CROSS/BLUE SHIELD | Admitting: Anesthesiology

## 2016-01-16 ENCOUNTER — Encounter: Payer: Self-pay | Admitting: *Deleted

## 2016-01-16 ENCOUNTER — Ambulatory Visit
Admission: RE | Admit: 2016-01-16 | Discharge: 2016-01-16 | Disposition: A | Discharge status: Home / Self Care | Payer: BLUE CROSS/BLUE SHIELD | Source: Ambulatory Visit | Attending: Urology | Admitting: Urology

## 2016-01-16 DIAGNOSIS — Z825 Family history of asthma and other chronic lower respiratory diseases: Secondary | ICD-10-CM | POA: Insufficient documentation

## 2016-01-16 DIAGNOSIS — Z87442 Personal history of urinary calculi: Secondary | ICD-10-CM | POA: Diagnosis not present

## 2016-01-16 DIAGNOSIS — G4700 Insomnia, unspecified: Secondary | ICD-10-CM | POA: Diagnosis not present

## 2016-01-16 DIAGNOSIS — Z823 Family history of stroke: Secondary | ICD-10-CM | POA: Diagnosis not present

## 2016-01-16 DIAGNOSIS — M1990 Unspecified osteoarthritis, unspecified site: Secondary | ICD-10-CM | POA: Diagnosis not present

## 2016-01-16 DIAGNOSIS — N200 Calculus of kidney: Secondary | ICD-10-CM

## 2016-01-16 DIAGNOSIS — Z79899 Other long term (current) drug therapy: Secondary | ICD-10-CM | POA: Diagnosis not present

## 2016-01-16 DIAGNOSIS — J309 Allergic rhinitis, unspecified: Secondary | ICD-10-CM | POA: Diagnosis not present

## 2016-01-16 DIAGNOSIS — Z87891 Personal history of nicotine dependence: Secondary | ICD-10-CM | POA: Diagnosis not present

## 2016-01-16 HISTORY — PX: CYSTOSCOPY W/ URETERAL STENT REMOVAL: SHX1430

## 2016-01-16 SURGERY — REMOVAL, STENT, URETER, CYSTOSCOPIC
Anesthesia: General | Laterality: Bilateral | Wound class: Clean Contaminated

## 2016-01-16 MED ORDER — FAMOTIDINE 20 MG PO TABS
20.0000 mg | ORAL_TABLET | Freq: Once | ORAL | Status: AC
  Administered 2016-01-16: 20 mg via ORAL

## 2016-01-16 MED ORDER — BELLADONNA ALKALOIDS-OPIUM 16.2-60 MG RE SUPP
RECTAL | Status: AC
  Filled 2016-01-16: qty 1

## 2016-01-16 MED ORDER — BELLADONNA ALKALOIDS-OPIUM 16.2-60 MG RE SUPP
RECTAL | Status: DC | PRN
  Administered 2016-01-16: 1 via RECTAL

## 2016-01-16 MED ORDER — KETAMINE HCL 50 MG/ML IJ SOLN
INTRAMUSCULAR | Status: DC | PRN
Start: 2016-01-16 — End: 2016-01-16
  Administered 2016-01-16: 10 mg via INTRAVENOUS

## 2016-01-16 MED ORDER — ALFENTANIL 500 MCG/ML IJ INJ
INJECTION | INTRAMUSCULAR | Status: DC | PRN
  Administered 2016-01-16: 1000 ug via INTRAVENOUS

## 2016-01-16 MED ORDER — CEFAZOLIN SODIUM 1-5 GM-% IV SOLN
1.0000 g | Freq: Once | INTRAVENOUS | Status: AC
  Administered 2016-01-16: 1 g via INTRAVENOUS

## 2016-01-16 MED ORDER — NUCYNTA 50 MG PO TABS: 50.0000 mg | ORAL_TABLET | Freq: Four times a day (QID) | ORAL | Status: DC | PRN

## 2016-01-16 MED ORDER — LIDOCAINE HCL 2 % EX GEL
CUTANEOUS | Status: DC | PRN
  Administered 2016-01-16: 1 via URETHRAL

## 2016-01-16 MED ORDER — MIDAZOLAM HCL 2 MG/2ML IJ SOLN
INTRAMUSCULAR | Status: DC | PRN
  Administered 2016-01-16 (×2): 1 mg via INTRAVENOUS

## 2016-01-16 MED ORDER — LACTATED RINGERS IV SOLN
INTRAVENOUS | Status: DC
  Administered 2016-01-16: 12:00:00 via INTRAVENOUS

## 2016-01-16 MED ORDER — URIBEL 118 MG PO CAPS: 1.0000 | ORAL_CAPSULE | Freq: Four times a day (QID) | ORAL | Status: DC | PRN

## 2016-01-16 MED ORDER — CEFAZOLIN SODIUM 1-5 GM-% IV SOLN
INTRAVENOUS | Status: AC
  Administered 2016-01-16: 1 g via INTRAVENOUS
  Filled 2016-01-16: qty 50

## 2016-01-16 MED ORDER — LEVOFLOXACIN 500 MG PO TABS: 500.0000 mg | ORAL_TABLET | Freq: Every day | ORAL | Status: DC

## 2016-01-16 MED ORDER — LIDOCAINE HCL 2 % EX GEL
CUTANEOUS | Status: AC
  Filled 2016-01-16: qty 10

## 2016-01-16 MED ORDER — PROMETHAZINE HCL 25 MG/ML IJ SOLN: 6.2500 mg | INTRAMUSCULAR | Status: DC | PRN

## 2016-01-16 MED ORDER — FENTANYL CITRATE (PF) 100 MCG/2ML IJ SOLN: 25.0000 ug | INTRAMUSCULAR | Status: DC | PRN

## 2016-01-16 MED ORDER — FAMOTIDINE 20 MG PO TABS
ORAL_TABLET | ORAL | Status: AC
  Administered 2016-01-16: 20 mg via ORAL
  Filled 2016-01-16: qty 1

## 2016-01-16 MED ORDER — FUROSEMIDE 10 MG/ML IJ SOLN: 10.0000 mg | Freq: Once | INTRAMUSCULAR | Status: DC

## 2016-01-16 SURGICAL SUPPLY — 17 items
BAG DRAIN CYSTO-URO LG1000N (MISCELLANEOUS) ×2 IMPLANT
GLOVE BIO SURGEON STRL SZ7 (GLOVE) ×4 IMPLANT
GLOVE BIO SURGEON STRL SZ7.5 (GLOVE) ×2 IMPLANT
GOWN STRL REUS W/ TWL LRG LVL3 (GOWN DISPOSABLE) ×1 IMPLANT
GOWN STRL REUS W/ TWL XL LVL3 (GOWN DISPOSABLE) ×1 IMPLANT
GOWN STRL REUS W/TWL LRG LVL3 (GOWN DISPOSABLE) ×1
GOWN STRL REUS W/TWL XL LVL3 (GOWN DISPOSABLE) ×1
GUIDEWIRE STR ZIPWIRE 035X150 (MISCELLANEOUS) ×2 IMPLANT
PACK CYSTO AR (MISCELLANEOUS) ×2 IMPLANT
PREP PVP WINGED SPONGE (MISCELLANEOUS) ×2 IMPLANT
SET CYSTO W/LG BORE CLAMP LF (SET/KITS/TRAYS/PACK) ×2 IMPLANT
SOL .9 NS 3000ML IRR  AL (IV SOLUTION) ×1
SOL .9 NS 3000ML IRR UROMATIC (IV SOLUTION) ×1 IMPLANT
SOL PREP PVP 2OZ (MISCELLANEOUS) ×2
SOLUTION PREP PVP 2OZ (MISCELLANEOUS) ×1 IMPLANT
SURGILUBE 2OZ TUBE FLIPTOP (MISCELLANEOUS) ×2 IMPLANT
WATER STERILE IRR 1000ML POUR (IV SOLUTION) ×2 IMPLANT

## 2016-01-16 NOTE — Discharge Instructions (Addendum)
Kidney Stones °Kidney stones (urolithiasis) are deposits that form inside your kidneys. The intense pain is caused by the stone moving through the urinary tract. When the stone moves, the ureter goes into spasm around the stone. The stone is usually passed in the urine.  °CAUSES  °· A disorder that makes certain neck glands produce too much parathyroid hormone (primary hyperparathyroidism). °· A buildup of uric acid crystals, similar to gout in your joints. °· Narrowing (stricture) of the ureter. °· A kidney obstruction present at birth (congenital obstruction). °· Previous surgery on the kidney or ureters. °· Numerous kidney infections. °SYMPTOMS  °· Feeling sick to your stomach (nauseous). °· Throwing up (vomiting). °· Blood in the urine (hematuria). °· Pain that usually spreads (radiates) to the groin. °· Frequency or urgency of urination. °DIAGNOSIS  °· Taking a history and physical exam. °· Blood or urine tests. °· CT scan. °· Occasionally, an examination of the inside of the urinary bladder (cystoscopy) is performed. °TREATMENT  °· Observation. °· Increasing your fluid intake. °· Extracorporeal shock wave lithotripsy--This is a noninvasive procedure that uses shock waves to break up kidney stones. °· Surgery may be needed if you have severe pain or persistent obstruction. There are various surgical procedures. Most of the procedures are performed with the use of small instruments. Only small incisions are needed to accommodate these instruments, so recovery time is minimized. °The size, location, and chemical composition are all important variables that will determine the proper choice of action for you. Talk to your health care provider to better understand your situation so that you will minimize the risk of injury to yourself and your kidney.  °HOME CARE INSTRUCTIONS  °· Drink enough water and fluids to keep your urine clear or pale yellow. This will help you to pass the stone or stone fragments. °· Strain  all urine through the provided strainer. Keep all particulate matter and stones for your health care provider to see. The stone causing the pain may be as small as a grain of salt. It is very important to use the strainer each and every time you pass your urine. The collection of your stone will allow your health care provider to analyze it and verify that a stone has actually passed. The stone analysis will often identify what you can do to reduce the incidence of recurrences. °· Only take over-the-counter or prescription medicines for pain, discomfort, or fever as directed by your health care provider. °· Keep all follow-up visits as told by your health care provider. This is important. °· Get follow-up X-rays if required. The absence of pain does not always mean that the stone has passed. It may have only stopped moving. If the urine remains completely obstructed, it can cause loss of kidney function or even complete destruction of the kidney. It is your responsibility to make sure X-rays and follow-ups are completed. Ultrasounds of the kidney can show blockages and the status of the kidney. Ultrasounds are not associated with any radiation and can be performed easily in a matter of minutes. °· Make changes to your daily diet as told by your health care provider. You may be told to: °¨ Limit the amount of salt that you eat. °¨ Eat 5 or more servings of fruits and vegetables each day. °¨ Limit the amount of meat, poultry, fish, and eggs that you eat. °· Collect a 24-hour urine sample as told by your health care provider. You may need to collect another urine sample every 6-12   months. SEEK MEDICAL CARE IF:  You experience pain that is progressive and unresponsive to any pain medicine you have been prescribed. SEEK IMMEDIATE MEDICAL CARE IF:   Pain cannot be controlled with the prescribed medicine.  You have a fever or shaking chills.  The severity or intensity of pain increases over 18 hours and is not  relieved by pain medicine.  You develop a new onset of abdominal pain.  You feel faint or pass out.  You are unable to urinate.   This information is not intended to replace advice given to you by your health care provider. Make sure you discuss any questions you have with your health care provider.   Document Released: 11/18/2005 Document Revised: 08/09/2015 Document Reviewed: 04/21/2013 Elsevier Interactive Patient Education 2016 [place].  Kidney Stones Kidney stones (urolithiasis) are solid masses that form inside your kidneys. The intense pain is caused by the stone moving through the kidney, ureter, bladder, and urethra (urinary tract). When the stone moves, the ureter starts to spasm around the stone. The stone is usually passed in your pee (urine).  HOME CARE  Drink enough fluids to keep your pee clear or pale yellow. This helps to get the stone out.  Take a 24-hour pee (urine) sample as told by your doctor. You may need to take another sample every 6-12 months.  Strain all pee through the provided strainer. Do not pee without peeing through the strainer, not even once. If you pee the stone out, catch it in the strainer. The stone may be as small as a grain of salt. Take this to your doctor. This will help your doctor figure out what you can do to try to prevent more kidney stones.  Only take medicine as told by your doctor.  Make changes to your daily diet as told by your doctor. You may be told to:  Limit how much salt you eat.  Eat 5 or more servings of fruits and vegetables each day.  Limit how much meat, poultry, fish, and eggs you eat.  Keep all follow-up visits as told by your doctor. This is important.  Get follow-up X-rays as told by your doctor. GET HELP IF: You have pain that gets worse even if you have been taking pain medicine. GET HELP RIGHT AWAY IF:   Your pain does not get better with medicine.  You have a fever or shaking chills.  Your pain  increases and gets worse over 18 hours.  You have new belly (abdominal) pain.  You feel faint or pass out.  You are unable to pee.   This information is not intended to replace advice given to you by your health care provider. Make sure you discuss any questions you have with your health care provider.   Document Released: 05/06/2008 Document Revised: 08/09/2015 Document Reviewed: 04/21/2013 Elsevier Interactive Patient Education 2016 [place].  Renal Colic Renal colic is pain that is caused by a kidney stone. HOME CARE  Take medicines only as told by your doctor.  Ask your doctor if it is okay to take over-the-counter medicine for pain.  Drink enough fluid to keep your pee (urine) clear or pale yellow. Drink 6-8 glasses of water each day.  Eat less than 2 grams of salt per day.  Eat less protein. Some foods that have protein are meats, fish, nuts, and dairy.  Try not to eat spinach, rhubarb, nuts, or bran. GET HELP IF:  You have a fever or chills.  Your  pee smells bad or looks cloudy.  You have pain or burning when you pee. GET HELP RIGHT AWAY IF:  The pain in your side (flank) or your groin suddenly gets worse.  You get confused.  You pass out.   This information is not intended to replace advice given to you by your health care provider. Make sure you discuss any questions you have with your health care provider.   Document Released: 05/06/2008 Document Revised: 12/09/2014 Document Reviewed: 09/28/2014 Elsevier Interactive Patient Education 2016 [place]   1) The drugs that you were given will stay in your system until tomorrow so for the next 24 hours you should not:  A) Drive an automobile B) Make any legal decisions C) Drink any alcoholic beverage   2) You may resume regular meals tomorrow.  Today it is better to start with liquids and gradually work up to solid foods.  You may eat anything you  prefer, but it is better to start with liquids, then soup and crackers, and gradually work up to solid foods.   3) Please notify your doctor immediately if you have any unusual bleeding, trouble breathing, redness and pain at the surgery site, drainage, fever, or pain not relieved by medication.    4) Additional Instructions:        Please contact your physician with any problems or Same Day Surgery at [phone], Monday through Friday 6 am to 4 pm, or [place] at [facility] number at [phone].

## 2016-01-16 NOTE — Anesthesia Preprocedure Evaluation (Signed)
Anesthesia Evaluation  Patient identified by MRN, date of birth, ID band Patient awake    Reviewed: Allergy & Precautions, H&P , NPO status , Patient's Chart, lab work & pertinent test results, reviewed documented beta blocker date and time   Airway Mallampati: II  TM Distance: >3 FB Neck ROM: full    Dental  (+) Teeth Intact   Pulmonary neg pulmonary ROS, former smoker,    Pulmonary exam normal        Cardiovascular Exercise Tolerance: Good negative cardio ROS Normal cardiovascular exam Rate:Normal     Neuro/Psych PSYCHIATRIC DISORDERS negative neurological ROS  negative psych ROS   GI/Hepatic negative GI ROS, Neg liver ROS,   Endo/Other  negative endocrine ROS  Renal/GU Renal diseasenegative Renal ROS  negative genitourinary   Musculoskeletal   Abdominal   Peds  Hematology negative hematology ROS (+)   Anesthesia Other Findings   Reproductive/Obstetrics negative OB ROS                             Anesthesia Physical Anesthesia Plan  ASA: II  Anesthesia Plan: General LMA   Post-op Pain Management:    Induction:   Airway Management Planned:   Additional Equipment:   Intra-op Plan:   Post-operative Plan:   Informed Consent: I have reviewed the patients History and Physical, chart, labs and discussed the procedure including the risks, benefits and alternatives for the proposed anesthesia with the patient or authorized representative who has indicated his/her understanding and acceptance.     Plan Discussed with: CRNA  Anesthesia Plan Comments:         Anesthesia Quick Evaluation

## 2016-01-16 NOTE — Op Note (Signed)
Preoperative diagnosis: Bilateral nephrolithiasis  Postoperative diagnosis: Same  Procedure: Cystoscopy with bilateral stent retrieval    Surgeon: Otelia Limes. Yves Dill MD, FACS  Anesthesia: Gen.   Indications:See the history and physical. After informed consent the above procedure(s) were requested      Technique and findings: After adequate sedation had been obtained patient was placed into dorsal lithotomy position and the perineum was prepped and draped in the usual fashion. 10 cc of viscous Xylocaine was instilled within the urethra and the bladder. After adequate local anesthesia been obtained the 21 French cystoscope was coupled to the camera and placed into the bladder. The bladder was thoroughly inspected. No bladder mucosal lesions were identified. Both ureteral stents were seen exiting the ureteral orifices. The left stent is grasped with the alligator forceps and removed. The right stent was removed in an identical fashion. The cystoscope was then removed. The procedure was then terminated and patient transferred to the recovery room in stable condition.

## 2016-01-16 NOTE — Anesthesia Postprocedure Evaluation (Signed)
Anesthesia Post Note  Patient: Alison Osborne  Procedure(s) Performed: Procedure(s) (LRB): CYSTOSCOPY WITH STENT REMOVAL (Bilateral)  Patient location during evaluation: PACU Anesthesia Type: General Level of consciousness: awake and alert Pain management: pain level controlled Vital Signs Assessment: post-procedure vital signs reviewed and stable Respiratory status: spontaneous breathing, nonlabored ventilation, respiratory function stable and patient connected to nasal cannula oxygen Cardiovascular status: blood pressure returned to baseline and stable Postop Assessment: no signs of nausea or vomiting Anesthetic complications: no    Last Vitals:  Filed Vitals:   01/16/16 1406 01/16/16 1430  BP: 149/89 150/90  Pulse: 65 65  Temp:    Resp: 16     Last Pain: There were no vitals filed for this visit.               Martha Clan

## 2016-01-16 NOTE — Transfer of Care (Signed)
Immediate Anesthesia Transfer of Care Note  Patient: Alison Osborne  Procedure(s) Performed: Procedure(s): CYSTOSCOPY WITH STENT REMOVAL (Bilateral)  Patient Location: PACU  Anesthesia Type:MAC  Level of Consciousness: awake, alert , oriented and patient cooperative  Airway & Oxygen Therapy: Patient Spontanous Breathing and Patient connected to nasal cannula oxygen  Post-op Assessment: Report given to RN and Post -op Vital signs reviewed and stable  Post vital signs: Reviewed and stable  Last Vitals:  Filed Vitals:   01/16/16 1200 01/16/16 1314  BP: 142/88 126/88  Pulse: 71 66  Temp: 36.8 C 36.9 C  Resp: 16 14    Complications: No apparent anesthesia complications

## 2016-01-16 NOTE — H&P (Signed)
Date of Initial H&P: 01/11/16  History reviewed, patient examined, no change in status, stable for surgery.

## 2016-06-27 ENCOUNTER — Other Ambulatory Visit: Payer: Self-pay | Admitting: Family Medicine

## 2016-06-27 DIAGNOSIS — Z1231 Encounter for screening mammogram for malignant neoplasm of breast: Secondary | ICD-10-CM

## 2016-07-10 ENCOUNTER — Ambulatory Visit
Admission: RE | Admit: 2016-07-10 | Discharge: 2016-07-10 | Disposition: A | Discharge status: Home / Self Care | Payer: BLUE CROSS/BLUE SHIELD | Source: Ambulatory Visit | Attending: Family Medicine | Admitting: Family Medicine

## 2016-07-10 ENCOUNTER — Other Ambulatory Visit: Payer: Self-pay | Admitting: Family Medicine

## 2016-07-10 DIAGNOSIS — Z1231 Encounter for screening mammogram for malignant neoplasm of breast: Secondary | ICD-10-CM | POA: Diagnosis present

## 2017-02-02 IMAGING — CT CT RENAL STONE PROTOCOL
1 of 2 series · 15 of 32 positions shown, 19 images · non-contrast
Comparison: Renal ultrasound from the same day.

CLINICAL DATA: Kidney stents. Left-sided hydronephrosis. Hematuria.

EXAM:
CT ABDOMEN AND PELVIS WITHOUT CONTRAST
TECHNIQUE: Multidetector CT imaging of the abdomen and pelvis was performed
following the standard protocol without IV contrast.

[Series 2: stone standard full · axial · 0.80mm/px · z∈[-408,+26]mm · 15 of 95 slices shown, 19 images]
[im 4/95  soft-tissue]
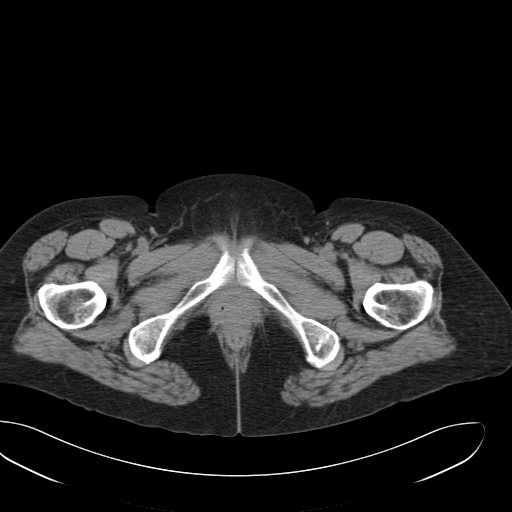
[im 4/95  bone]
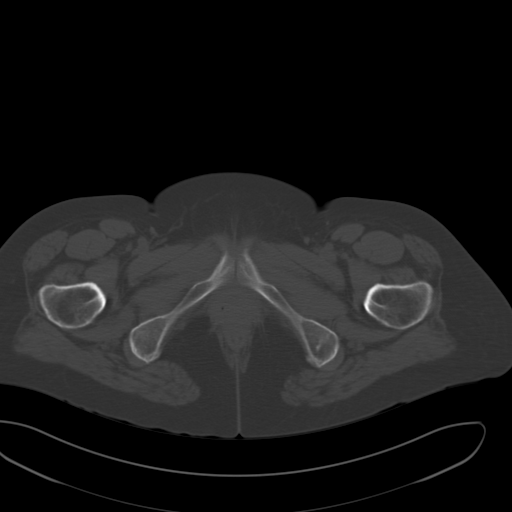
[im 12/95  soft-tissue]
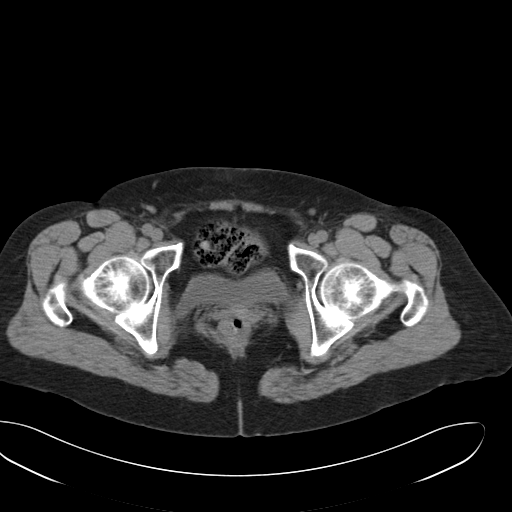
[im 20/95  soft-tissue]
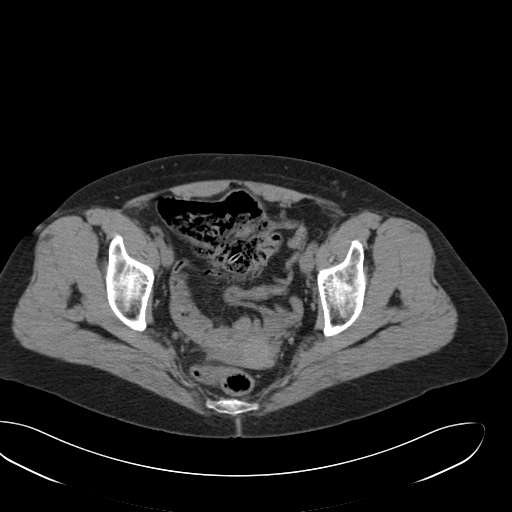
[im 28/95  soft-tissue]
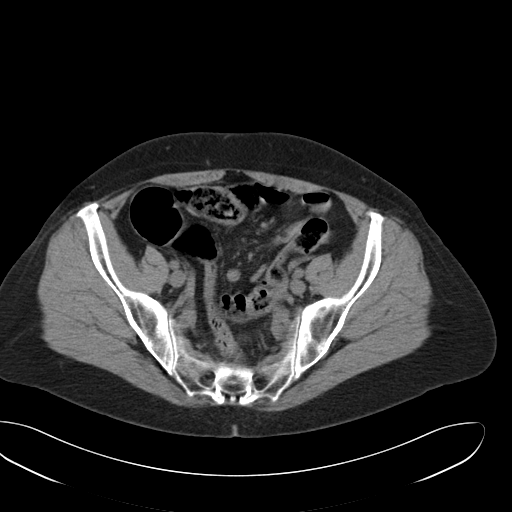
[im 32/95  soft-tissue]
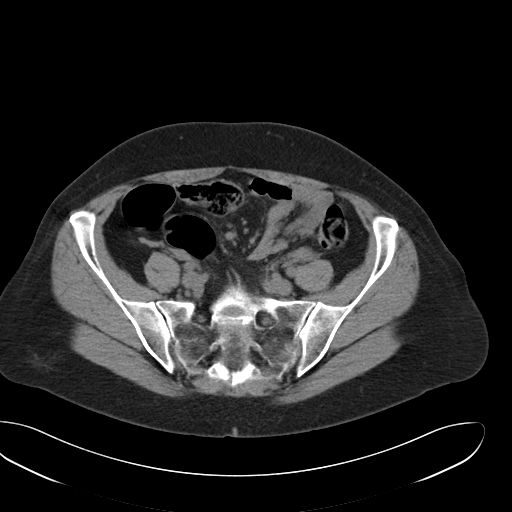
[im 40/95  soft-tissue]
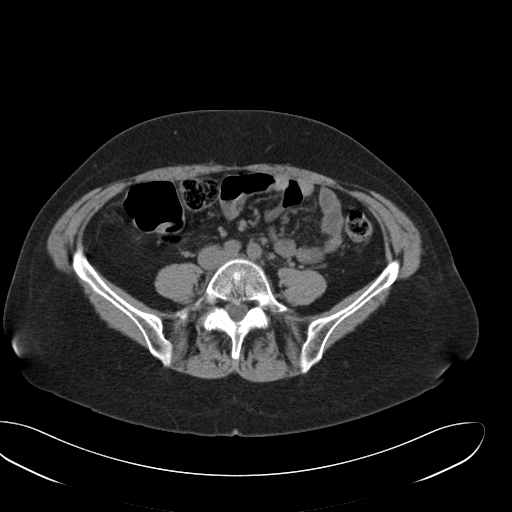
[im 48/95  soft-tissue]
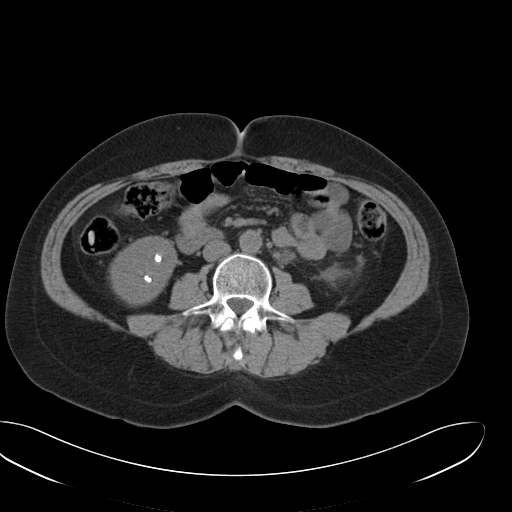
[im 55/95  soft-tissue]
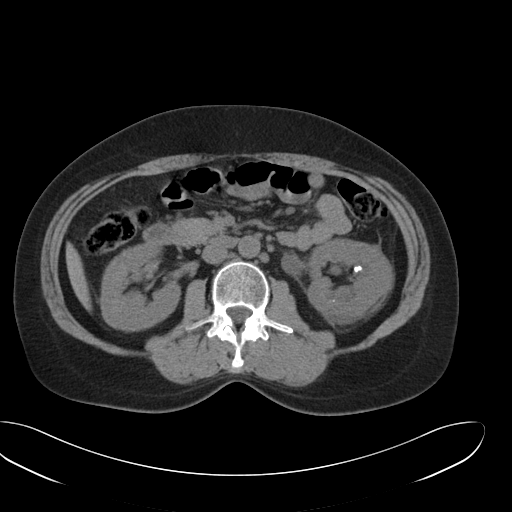
[im 63/95  soft-tissue]
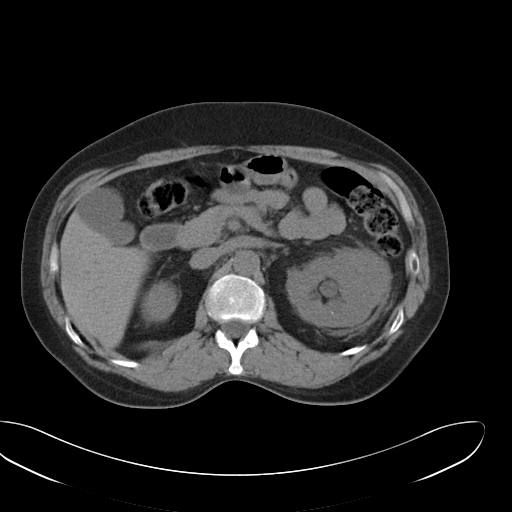
[im 63/95  bone]
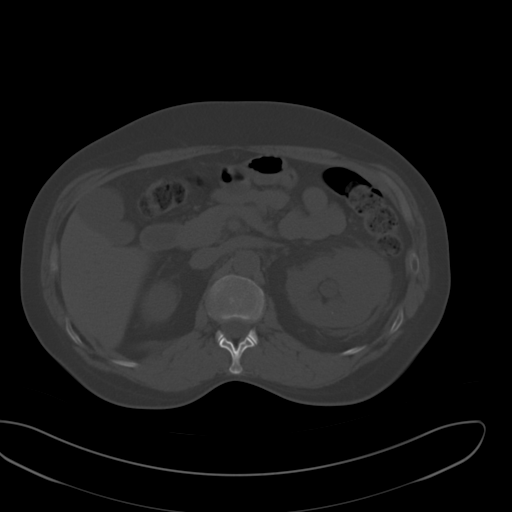
[im 67/95  soft-tissue]
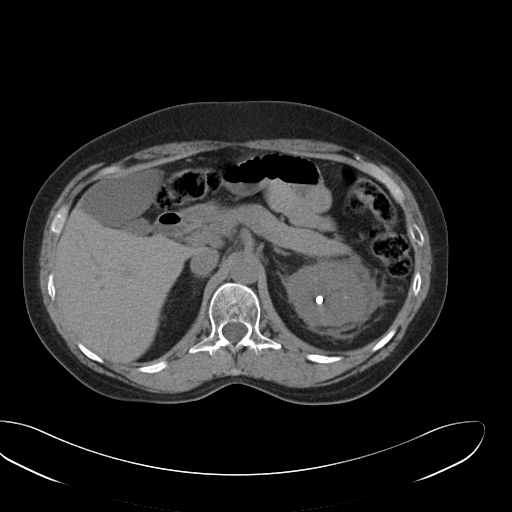
[im 75/95  soft-tissue]
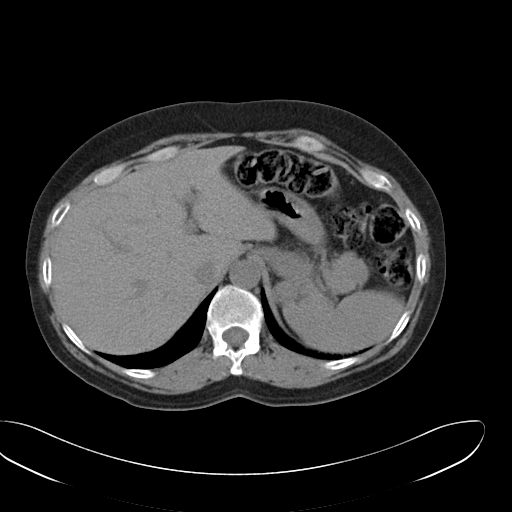
[im 79/95  lung]
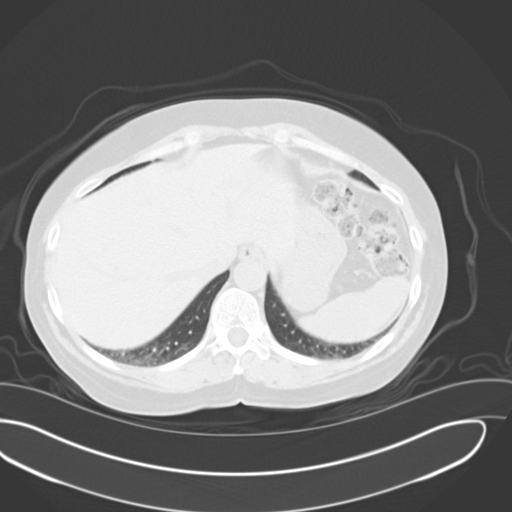
[im 83/95  soft-tissue]
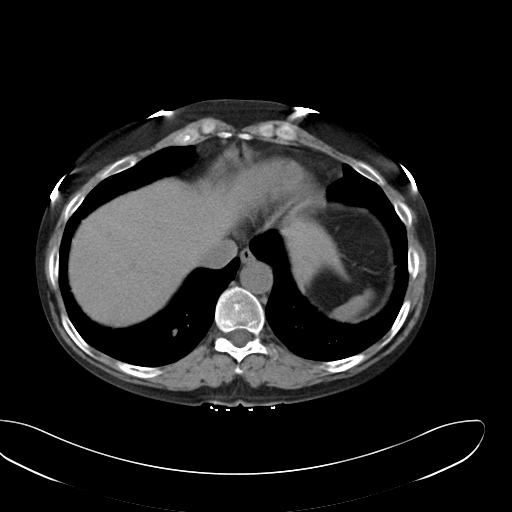
[im 83/95  lung]
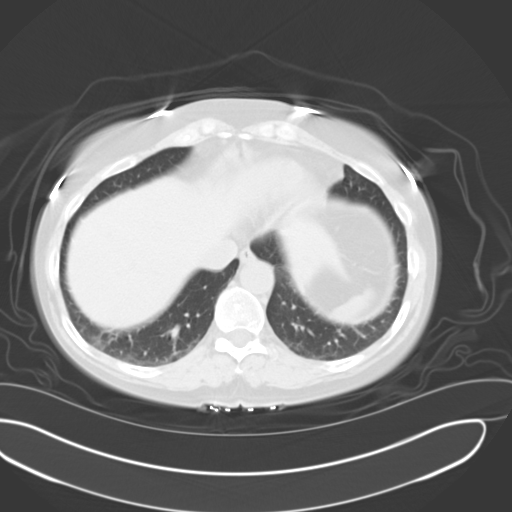
[im 87/95  lung]
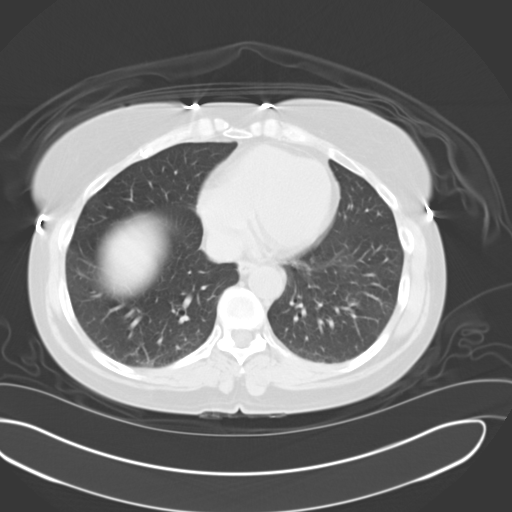
[im 91/95  soft-tissue]
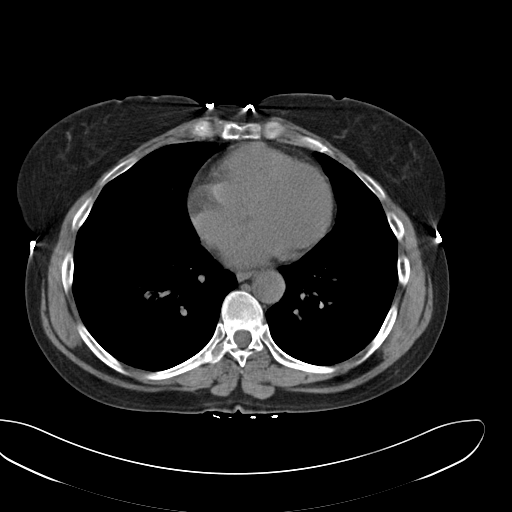
[im 91/95  lung]
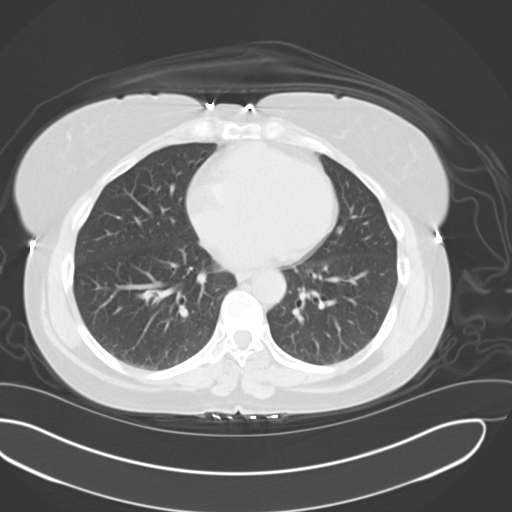

[15 of 32 positions shown; findings below may reference images not displayed]

FINDINGS: Lower chest: Mild dependent atelectasis is present at the lung bases
bilaterally. Heart size is normal. No significant pleural or
pericardial effusion is present.

Hepatobiliary: The liver, common bile duct, and gallbladder are
within normal limits.

Pancreas: Within normal limits.

Spleen: Negative

Adrenals/Urinary Tract: Adrenal glands are normal bilaterally. At
least 12 separate nonobstructing stones are present in the right
kidney. The largest measures 7.5 mm at the lower pole.

At least 15 nonobstructing stones are present in the the left
kidney. The largest measures 9 mm.

Moderate left-sided hydronephrosis is present. An obstructing 7 mm
stone is present in the mid left ureter at the level of L4. The more
distal left ureter is within normal limits. The urinary bladder is
mostly collapsed.

Stomach/Bowel: The stomach and duodenum are within normal limits.
The small bowel is within normal limits.

The rectosigmoid colon demonstrates minimal diverticular change
without focal inflammation to suggest diverticulitis. The more
proximal colon is unremarkable. The appendix is visualized and
within normal limits deep within the anatomic pelvis.

Vascular/Lymphatic: Negative

Reproductive: Uterus and adnexa are within normal limits for age.

Other: No significant free fluid is present

Musculoskeletal: The bone windows are unremarkable.
IMPRESSION: 1. Obstructing 7 mm stone in the midportion of the left ureter with
moderate left-sided hydronephrosis.
2. Multiple additional nonobstructing stones are evident in both
kidneys.
3. Minimal sigmoid diverticular change without diverticulitis.

## 2017-06-23 ENCOUNTER — Other Ambulatory Visit: Payer: Self-pay | Admitting: Family Medicine

## 2017-06-23 DIAGNOSIS — Z1231 Encounter for screening mammogram for malignant neoplasm of breast: Secondary | ICD-10-CM

## 2017-07-14 ENCOUNTER — Ambulatory Visit
Admission: RE | Admit: 2017-07-14 | Discharge: 2017-07-14 | Disposition: A | Discharge status: Home / Self Care | Payer: BLUE CROSS/BLUE SHIELD | Source: Ambulatory Visit | Attending: Family Medicine | Admitting: Family Medicine

## 2017-07-14 DIAGNOSIS — Z1231 Encounter for screening mammogram for malignant neoplasm of breast: Secondary | ICD-10-CM | POA: Diagnosis present

## 2017-12-29 DIAGNOSIS — M2142 Flat foot [pes planus] (acquired), left foot: Secondary | ICD-10-CM | POA: Insufficient documentation

## 2018-01-29 DIAGNOSIS — M76822 Posterior tibial tendinitis, left leg: Secondary | ICD-10-CM | POA: Insufficient documentation

## 2018-01-29 DIAGNOSIS — M6702 Short Achilles tendon (acquired), left ankle: Secondary | ICD-10-CM | POA: Insufficient documentation

## 2018-01-29 DIAGNOSIS — M216X9 Other acquired deformities of unspecified foot: Secondary | ICD-10-CM | POA: Insufficient documentation

## 2018-06-23 DIAGNOSIS — F411 Generalized anxiety disorder: Secondary | ICD-10-CM | POA: Insufficient documentation

## 2018-06-23 DIAGNOSIS — J310 Chronic rhinitis: Secondary | ICD-10-CM | POA: Insufficient documentation

## 2018-06-24 ENCOUNTER — Other Ambulatory Visit: Payer: Self-pay | Admitting: Family Medicine

## 2018-06-24 DIAGNOSIS — R590 Localized enlarged lymph nodes: Secondary | ICD-10-CM

## 2018-06-24 DIAGNOSIS — Z0000 Encounter for general adult medical examination without abnormal findings: Secondary | ICD-10-CM

## 2018-07-02 ENCOUNTER — Ambulatory Visit
Admission: RE | Admit: 2018-07-02 | Discharge: 2018-07-02 | Disposition: A | Discharge status: Home / Self Care | Payer: BLUE CROSS/BLUE SHIELD | Source: Ambulatory Visit | Attending: Family Medicine | Admitting: Family Medicine

## 2018-07-02 DIAGNOSIS — R590 Localized enlarged lymph nodes: Secondary | ICD-10-CM

## 2018-07-02 DIAGNOSIS — Z0000 Encounter for general adult medical examination without abnormal findings: Secondary | ICD-10-CM

## 2018-07-16 ENCOUNTER — Other Ambulatory Visit: Payer: Self-pay | Admitting: Family Medicine

## 2018-07-16 DIAGNOSIS — E041 Nontoxic single thyroid nodule: Secondary | ICD-10-CM

## 2018-07-17 ENCOUNTER — Other Ambulatory Visit: Payer: Self-pay | Admitting: Family Medicine

## 2018-07-17 DIAGNOSIS — Z1231 Encounter for screening mammogram for malignant neoplasm of breast: Secondary | ICD-10-CM

## 2018-07-28 ENCOUNTER — Ambulatory Visit
Admission: RE | Admit: 2018-07-28 | Discharge: 2018-07-28 | Disposition: A | Discharge status: Home / Self Care | Payer: BLUE CROSS/BLUE SHIELD | Source: Ambulatory Visit | Attending: Family Medicine | Admitting: Family Medicine

## 2018-07-28 DIAGNOSIS — E041 Nontoxic single thyroid nodule: Secondary | ICD-10-CM | POA: Diagnosis present

## 2018-07-28 DIAGNOSIS — E042 Nontoxic multinodular goiter: Secondary | ICD-10-CM | POA: Insufficient documentation

## 2018-07-29 ENCOUNTER — Ambulatory Visit
Admission: RE | Admit: 2018-07-29 | Discharge: 2018-07-29 | Disposition: A | Discharge status: Home / Self Care | Payer: BLUE CROSS/BLUE SHIELD | Source: Ambulatory Visit | Attending: Family Medicine | Admitting: Family Medicine

## 2018-07-29 DIAGNOSIS — Z1231 Encounter for screening mammogram for malignant neoplasm of breast: Secondary | ICD-10-CM

## 2018-08-24 DIAGNOSIS — S93135A Subluxation of interphalangeal joint of left lesser toe(s), initial encounter: Secondary | ICD-10-CM | POA: Insufficient documentation

## 2019-02-02 DIAGNOSIS — S82142A Displaced bicondylar fracture of left tibia, initial encounter for closed fracture: Secondary | ICD-10-CM | POA: Insufficient documentation

## 2019-10-12 ENCOUNTER — Other Ambulatory Visit: Payer: Self-pay | Admitting: Family Medicine

## 2019-10-12 DIAGNOSIS — Z1231 Encounter for screening mammogram for malignant neoplasm of breast: Secondary | ICD-10-CM

## 2020-01-28 ENCOUNTER — Ambulatory Visit: Payer: BC Managed Care – PPO | Attending: Internal Medicine

## 2020-01-28 DIAGNOSIS — Z23 Encounter for immunization: Secondary | ICD-10-CM | POA: Insufficient documentation

## 2020-01-28 NOTE — Progress Notes (Signed)
   Covid-19 Vaccination Clinic  Name:  Alison Osborne    MRN: [id number] DOB: 05/25/1961  01/28/2020  [name] was observed post Covid-19 immunization for 15 minutes without incidence. She was provided with Vaccine Information Sheet and instruction to access the V-Safe system.   [name] was instructed to call 911 with any severe reactions post vaccine: Marland Kitchen Difficulty breathing  . Swelling of your face and throat  . A fast heartbeat  . A bad rash all over your body  . Dizziness and weakness    Immunizations Administered    Name Date Dose VIS Date Route   Pfizer COVID-19 Vaccine 01/28/2020  9:38 AM 0.3 mL 11/12/2019 Intramuscular   Manufacturer: [place]   Lot: [id number]   [place]: [id number]

## 2020-02-01 ENCOUNTER — Other Ambulatory Visit: Payer: Self-pay

## 2020-02-01 ENCOUNTER — Ambulatory Visit
Admission: RE | Admit: 2020-02-01 | Discharge: 2020-02-01 | Disposition: A | Discharge status: Home / Self Care | Payer: BC Managed Care – PPO | Source: Ambulatory Visit | Attending: Family Medicine | Admitting: Family Medicine

## 2020-02-01 DIAGNOSIS — Z1231 Encounter for screening mammogram for malignant neoplasm of breast: Secondary | ICD-10-CM | POA: Insufficient documentation

## 2020-02-07 ENCOUNTER — Other Ambulatory Visit: Payer: Self-pay | Admitting: Family Medicine

## 2020-02-07 DIAGNOSIS — N6310 Unspecified lump in the right breast, unspecified quadrant: Secondary | ICD-10-CM

## 2020-02-07 DIAGNOSIS — R928 Other abnormal and inconclusive findings on diagnostic imaging of breast: Secondary | ICD-10-CM

## 2020-02-08 ENCOUNTER — Ambulatory Visit
Admission: RE | Admit: 2020-02-08 | Discharge: 2020-02-08 | Disposition: A | Discharge status: Home / Self Care | Payer: BC Managed Care – PPO | Source: Ambulatory Visit | Attending: Family Medicine | Admitting: Family Medicine

## 2020-02-08 DIAGNOSIS — N6310 Unspecified lump in the right breast, unspecified quadrant: Secondary | ICD-10-CM | POA: Diagnosis present

## 2020-02-08 DIAGNOSIS — R928 Other abnormal and inconclusive findings on diagnostic imaging of breast: Secondary | ICD-10-CM | POA: Insufficient documentation

## 2020-02-11 ENCOUNTER — Other Ambulatory Visit: Payer: Self-pay | Admitting: Family Medicine

## 2020-02-11 DIAGNOSIS — N6320 Unspecified lump in the left breast, unspecified quadrant: Secondary | ICD-10-CM

## 2020-02-11 DIAGNOSIS — R928 Other abnormal and inconclusive findings on diagnostic imaging of breast: Secondary | ICD-10-CM

## 2020-02-18 ENCOUNTER — Ambulatory Visit
Admission: RE | Admit: 2020-02-18 | Discharge: 2020-02-18 | Disposition: A | Discharge status: Home / Self Care | Payer: BC Managed Care – PPO | Source: Ambulatory Visit | Attending: Family Medicine | Admitting: Family Medicine

## 2020-02-18 DIAGNOSIS — R928 Other abnormal and inconclusive findings on diagnostic imaging of breast: Secondary | ICD-10-CM | POA: Diagnosis not present

## 2020-02-18 DIAGNOSIS — N6320 Unspecified lump in the left breast, unspecified quadrant: Secondary | ICD-10-CM | POA: Diagnosis present

## 2020-02-21 LAB — SURGICAL PATHOLOGY

## 2020-02-22 ENCOUNTER — Ambulatory Visit: Payer: BC Managed Care – PPO | Attending: Internal Medicine

## 2020-02-22 DIAGNOSIS — Z23 Encounter for immunization: Secondary | ICD-10-CM

## 2020-02-22 NOTE — Progress Notes (Signed)
   Covid-19 Vaccination Clinic  Name:  Alison Osborne    MRN: [id number] DOB: 17-Dec-1960  02/22/2020  Alison Osborne was observed post Covid-19 immunization for 15 minutes without incident. She was provided with Vaccine Information Sheet and instruction to access the V-Safe system.   Alison Osborne was instructed to call 911 with any severe reactions post vaccine: Marland Kitchen Difficulty breathing  . Swelling of face and throat  . A fast heartbeat  . A bad rash all over body  . Dizziness and weakness   Immunizations Administered    Name Date Dose VIS Date Route   Pfizer COVID-19 Vaccine 02/22/2020 10:21 AM 0.3 mL 11/12/2019 Intramuscular   Manufacturer: [place]   Lot: [id number]   [place]: [id number]

## 2020-03-23 ENCOUNTER — Emergency Department
Admission: EM | Admit: 2020-03-23 | Discharge: 2020-03-23 | Disposition: A | Discharge status: Home / Self Care | Payer: BC Managed Care – PPO | Attending: Emergency Medicine | Admitting: Emergency Medicine

## 2020-03-23 ENCOUNTER — Other Ambulatory Visit: Payer: Self-pay

## 2020-03-23 ENCOUNTER — Emergency Department: Payer: BC Managed Care – PPO

## 2020-03-23 ENCOUNTER — Encounter: Payer: Self-pay | Admitting: Emergency Medicine

## 2020-03-23 DIAGNOSIS — Z20822 Contact with and (suspected) exposure to covid-19: Secondary | ICD-10-CM | POA: Insufficient documentation

## 2020-03-23 DIAGNOSIS — N201 Calculus of ureter: Secondary | ICD-10-CM

## 2020-03-23 DIAGNOSIS — Z87891 Personal history of nicotine dependence: Secondary | ICD-10-CM | POA: Diagnosis not present

## 2020-03-23 DIAGNOSIS — Z85828 Personal history of other malignant neoplasm of skin: Secondary | ICD-10-CM | POA: Diagnosis not present

## 2020-03-23 DIAGNOSIS — Z79899 Other long term (current) drug therapy: Secondary | ICD-10-CM | POA: Diagnosis not present

## 2020-03-23 DIAGNOSIS — R109 Unspecified abdominal pain: Secondary | ICD-10-CM | POA: Diagnosis present

## 2020-03-23 LAB — BASIC METABOLIC PANEL
Anion gap: 9 (ref 5–15)
BUN: 18 mg/dL (ref 6–20)
CO2: 24 mmol/L (ref 22–32)
Calcium: 9.4 mg/dL (ref 8.9–10.3)
Chloride: 102 mmol/L (ref 98–111)
Creatinine, Ser: 0.88 mg/dL (ref 0.44–1.00)
GFR calc Af Amer: >60 mL/min (ref >60)
GFR calc non Af Amer: >60 mL/min (ref >60)
Glucose, Bld: 141 mg/dL — ABNORMAL HIGH (ref 70–99)
Potassium: 3.9 mmol/L (ref 3.5–5.1)
Sodium: 135 mmol/L (ref 135–145)

## 2020-03-23 LAB — CBC
HCT: 41.2 % (ref 36.0–46.0)
Hemoglobin: 13.6 g/dL (ref 12.0–15.0)
MCH: 28.9 pg (ref 26.0–34.0)
MCHC: 33 g/dL (ref 30.0–36.0)
MCV: 87.5 fL (ref 80.0–100.0)
Platelets: 371 10*3/uL (ref 150–400)
RBC: 4.71 MIL/uL (ref 3.87–5.11)
RDW: 13.2 % (ref 11.5–15.5)
WBC: 10.8 10*3/uL — ABNORMAL HIGH (ref 4.0–10.5)
nRBC: 0 % (ref 0.0–0.2)

## 2020-03-23 LAB — URINALYSIS, COMPLETE (UACMP) WITH MICROSCOPIC
Bacteria, UA: NONE SEEN
Bilirubin Urine: NEGATIVE
Glucose, UA: NEGATIVE mg/dL
Ketones, ur: NEGATIVE mg/dL
Nitrite: NEGATIVE
Protein, ur: 100 mg/dL — AB
RBC / HPF: >50 RBC/hpf — ABNORMAL HIGH (ref 0–5)
Specific Gravity, Urine: 1.021 (ref 1.005–1.030)
pH: 5 (ref 5.0–8.0)

## 2020-03-23 LAB — RESPIRATORY PANEL BY RT PCR (FLU A&B, COVID)
Influenza A by PCR: NEGATIVE
Influenza B by PCR: NEGATIVE
SARS Coronavirus 2 by RT PCR: NEGATIVE

## 2020-03-23 MED ORDER — ONDANSETRON HCL 4 MG/2ML IJ SOLN
4.0000 mg | Freq: Once | INTRAMUSCULAR | Status: AC
  Administered 2020-03-23: 16:00:00 4 mg via INTRAVENOUS
  Filled 2020-03-23: qty 2

## 2020-03-23 MED ORDER — KETOROLAC TROMETHAMINE 10 MG PO TABS: 10.0000 mg | ORAL_TABLET | Freq: Four times a day (QID) | ORAL | 0 refills | Status: DC | PRN

## 2020-03-23 MED ORDER — SODIUM CHLORIDE 0.9 % IV BOLUS
1000.0000 mL | Freq: Once | INTRAVENOUS | Status: AC
  Administered 2020-03-23: 16:00:00 1000 mL via INTRAVENOUS

## 2020-03-23 MED ORDER — KETOROLAC TROMETHAMINE 30 MG/ML IJ SOLN
15.0000 mg | INTRAMUSCULAR | Status: AC
  Administered 2020-03-23: 16:00:00 15 mg via INTRAVENOUS
  Filled 2020-03-23: qty 1

## 2020-03-23 MED ORDER — SODIUM CHLORIDE 0.9 % IV SOLN
1.0000 g | Freq: Once | INTRAVENOUS | Status: AC
  Administered 2020-03-23: 1 g via INTRAVENOUS
  Filled 2020-03-23: qty 10

## 2020-03-23 MED ORDER — HYDROMORPHONE HCL 1 MG/ML IJ SOLN
1.0000 mg | Freq: Once | INTRAMUSCULAR | Status: AC
  Administered 2020-03-23: 1 mg via INTRAVENOUS
  Filled 2020-03-23: qty 1

## 2020-03-23 NOTE — ED Provider Notes (Signed)
[facility] Emergency Department Provider Note  ____________________________________________  Time seen: Approximately 5:33 PM  I have reviewed the triage vital signs and the nursing notes.   HISTORY  Chief Complaint Flank Pain    HPI Alison Osborne is a 59 y.o. female with a history of kidney stones who comes ED complaining of severe left flank pain radiating to left lower quadrant with hematuria that started 2 days ago, constant, waxing waning.  10/10 in severity.  No aggravating or alleviating factors.  Tried Tylenol without relief.      Past Medical History:  Diagnosis Date  . Anxiety   . Cancer ([place])    skin cancer - squamous carcina  . Depression   . Renal disorder    stones     There are no problems to display for this patient.    Past Surgical History:  Procedure Laterality Date  . CESAREAN SECTION    . CYSTOSCOPY W/ URETERAL STENT REMOVAL Bilateral 01/16/2016   Procedure: CYSTOSCOPY WITH STENT REMOVAL;  Surgeon: Royston Cowper, MD;  Location: [facility] ORS;  Service: Urology;  Laterality: Bilateral;  . CYSTOSCOPY WITH STENT PLACEMENT Bilateral 11/09/2015   Procedure: CYSTOSCOPY WITH STENT PLACEMENT;  Surgeon: Royston Cowper, MD;  Location: [facility] ORS;  Service: Urology;  Laterality: Bilateral;  . EXTRACORPOREAL SHOCK WAVE LITHOTRIPSY Left 11/16/2015   Procedure: EXTRACORPOREAL SHOCK WAVE LITHOTRIPSY (ESWL);  Surgeon: Royston Cowper, MD;  Location: [facility] ORS;  Service: Urology;  Laterality: Left;  . EXTRACORPOREAL SHOCK WAVE LITHOTRIPSY Right 11/30/2015   Procedure: EXTRACORPOREAL SHOCK WAVE LITHOTRIPSY (ESWL);  Surgeon: Royston Cowper, MD;  Location: [facility] ORS;  Service: Urology;  Laterality: Right;  . EXTRACORPOREAL SHOCK WAVE LITHOTRIPSY Left 01/04/2016   Procedure: EXTRACORPOREAL SHOCK WAVE LITHOTRIPSY (ESWL);  Surgeon: Royston Cowper, MD;  Location: [facility] ORS;  Service: Urology;  Laterality: Left;  . TUBAL LIGATION       Prior to  Admission medications   Medication Sig Start Date End Date Taking? Authorizing Provider  azelastine (ASTELIN) 0.1 % nasal spray Place 1 spray into both nostrils 2 (two) times daily. Use in each nostril as directed    [provider]  Cholecalciferol (VITAMIN D3) 125 MCG (5000 UT) CAPS Take 5,000 Units by mouth daily.    [provider]  fluticasone (FLONASE) 50 MCG/ACT nasal spray Place 1 spray into both nostrils in the morning and at bedtime.    [provider]  ketorolac (TORADOL) 10 MG tablet Take 1 tablet (10 mg total) by mouth every 6 (six) hours as needed for moderate pain. 03/23/20   Carrie Mew, MD  TURMERIC PO Take 1,000 mg by mouth daily.    [provider]  venlafaxine (EFFEXOR) 37.5 MG tablet Take 37.5 mg by mouth every morning.     [provider]     Allergies Patient has no known allergies.   Family History  Problem Relation Age of Onset  . Diabetes Mother   . Prostate cancer Father   . Breast cancer Neg Hx     Social History Social History   Tobacco Use  . Smoking status: Former Research scientist (life sciences)  . Smokeless tobacco: Never Used  Substance Use Topics  . Alcohol use: Yes    Alcohol/week: 4.0 standard drinks    Types: 4 Glasses of wine per week    Comment: 4-5 glasses of wine a week  . Drug use: No    Review of Systems  Constitutional:   No fever or  chills.  ENT:   No sore throat. No rhinorrhea. Cardiovascular:   No chest pain or syncope. Respiratory:   No dyspnea or cough. Gastrointestinal: Positive left flank and left lower quadrant abdominal pain without vomiting and diarrhea.  Musculoskeletal:   Negative for focal pain or swelling All other systems reviewed and are negative except as documented above in ROS and HPI.  ____________________________________________   PHYSICAL EXAM:  VITAL SIGNS: ED Triage Vitals  Enc Vitals Group     BP 03/23/20 1322 128/74     Pulse Rate 03/23/20 1322 68     Resp 03/23/20  1322 16     Temp 03/23/20 1322 97.8 F (36.6 C)     Temp src --      SpO2 03/23/20 1322 96 %     Weight 03/23/20 1319 167 lb (75.8 kg)     Height 03/23/20 1319 5\' 8"  (1.727 m)     Head Circumference --      Peak Flow --      Pain Score 03/23/20 1319 8     Pain Loc --      Pain Edu? --      Excl. in [place]? --     Vital signs reviewed, nursing assessments reviewed.   Constitutional:   Alert and oriented. Non-toxic appearance. Eyes:   Conjunctivae are normal. EOMI. PERRL. ENT      Head:   Normocephalic and atraumatic.      Nose:   Wearing a mask.      Mouth/Throat:   Wearing a mask.      Neck:   No meningismus. Full ROM. Hematological/Lymphatic/Immunilogical:   No cervical lymphadenopathy. Cardiovascular:   RRR. Symmetric bilateral radial and DP pulses.  No murmurs. Cap refill less than 2 seconds. Respiratory:   Normal respiratory effort without tachypnea/retractions. Breath sounds are clear and equal bilaterally. No wheezes/rales/rhonchi. Gastrointestinal:   Soft with mild left lower quadrant tenderness. Non distended. There is no CVA tenderness.  No rebound, rigidity, or guarding. Musculoskeletal:   Normal range of motion in all extremities. No joint effusions.  No lower extremity tenderness.  No edema. Neurologic:   Normal speech and language.  Motor grossly intact. No acute focal neurologic deficits are appreciated.  Skin:    Skin is warm, dry and intact. No rash noted.  No petechiae, purpura, or bullae.  ____________________________________________    LABS (pertinent positives/negatives) (all labs ordered are listed, but only abnormal results are displayed) Labs Reviewed  BASIC METABOLIC PANEL - Abnormal; Notable for the following components:      Result Value   Glucose, Bld 141 (*)    All other components within normal limits  CBC - Abnormal; Notable for the following components:   WBC 10.8 (*)    All other components within normal limits  URINALYSIS, COMPLETE (UACMP)  WITH MICROSCOPIC - Abnormal; Notable for the following components:   Color, Urine AMBER (*)    APPearance CLOUDY (*)    Hgb urine dipstick LARGE (*)    Protein, ur 100 (*)    Leukocytes,Ua TRACE (*)    RBC / HPF >50 (*)    All other components within normal limits  URINE CULTURE  RESPIRATORY PANEL BY RT PCR (FLU A&B, COVID)   ____________________________________________   EKG    ____________________________________________    RADIOLOGY  CT Renal Stone Study  Result Date: 03/23/2020 CLINICAL DATA:  Flank pain. Hematuria. EXAM: CT ABDOMEN AND PELVIS WITHOUT CONTRAST TECHNIQUE: Multidetector CT imaging of the abdomen and  pelvis was performed following the standard protocol without IV contrast. COMPARISON:  CT scan dated 11/07/2015 FINDINGS: Lower chest: Normal. Hepatobiliary: No focal liver abnormality is seen. No gallstones, gallbladder wall thickening, or biliary dilatation. Pancreas: Unremarkable. No pancreatic ductal dilatation or surrounding inflammatory changes. Spleen: Normal in size without focal abnormality. Adrenals/Urinary Tract: Normal adrenal glands. Numerous bilateral renal calculi. 8 mm stone obstructing proximal left ureter creating moderate left hydronephrosis. No right renal calculi. Bladder is normal. Stomach/Bowel: The stomach and small bowel appear normal. Patient has a mobile cecum which at this time lies in the left upper quadrant. The cecum is dilated to a diameter of approximately 8 cm. Appendix and terminal ileum are. No evidence of cecal volvulus. Colon is otherwise normal except for a few diverticula in the sigmoid region. Vascular/Lymphatic: No significant vascular findings are present. No enlarged abdominal or pelvic lymph nodes. Reproductive: Uterus and bilateral adnexa are unremarkable. Other: No abdominal wall hernia or abnormality. No abdominopelvic ascites. Musculoskeletal: No acute or significant osseous findings. IMPRESSION: 1. 8 mm stone obstructing the  proximal left ureter creating moderate left hydronephrosis. 2. Numerous bilateral renal calculi. 3. Mobile cecum which at this time lies in the left upper quadrant. No evidence of cecal volvulus. Electronically Signed   By: Lorriane Shire M.D.   On: 03/23/2020 14:13    ____________________________________________   PROCEDURES Procedures  ____________________________________________  DIFFERENTIAL DIAGNOSIS   Ureterolithiasis, diverticulitis  CLINICAL IMPRESSION / ASSESSMENT AND PLAN / ED COURSE  Medications ordered in the ED: Medications  cefTRIAXone (ROCEPHIN) 1 g in sodium chloride 0.9 % 100 mL IVPB (1 g Intravenous New Bag/Given 03/23/20 1732)  sodium chloride 0.9 % bolus 1,000 mL (0 mLs Intravenous Stopped 03/23/20 1652)  ondansetron (ZOFRAN) injection 4 mg (4 mg Intravenous Given 03/23/20 1601)  HYDROmorphone (DILAUDID) injection 1 mg (1 mg Intravenous Given 03/23/20 1601)  ketorolac (TORADOL) 30 MG/ML injection 15 mg (15 mg Intravenous Given 03/23/20 1601)    Pertinent labs & imaging results that were available during my care of the patient were reviewed by me and considered in my medical decision making (see chart for details).  Alison Osborne was evaluated in Emergency Department on 03/23/2020 for the symptoms described in the history of present illness. She was evaluated in the context of the global COVID-19 pandemic, which necessitated consideration that the patient might be at risk for infection with the SARS-CoV-2 virus that causes COVID-19. Institutional protocols and algorithms that pertain to the evaluation of patients at risk for COVID-19 are in a state of rapid change based on information released by regulatory bodies including the CDC and federal and state organizations. These policies and algorithms were followed during the patient's care in the ED.   Patient presents with severe left flank pain most likely renal colic.  Labs unremarkable, urinalysis not consistent with  infection.  CT scan shows an 8 mm proximal stone in the left ureter causing hydronephrosis. Will give dilaudid 1mg  IV and consult urology.    Clinical Course as of Mar 23 1732  Thu Mar 23, 2020  1539 Case d/w Urology Dr. Diamantina Providence, who will eval in about 30 minutes. Rec toradol, covid screening, likely needs OR stent placement tomorrow.   [PS]    Clinical Course User Index [PS] Carrie Mew, MD    ----------------------------------------- 5:41 PM on 03/23/2020 -----------------------------------------  Pt feels much better. Pain controlled, stable for DC home to return to pre-op tomorrow.    ____________________________________________   FINAL CLINICAL IMPRESSION(S) / ED DIAGNOSES  Final diagnoses:  Ureterolithiasis  left flank pain   ED Discharge Orders         Ordered    ketorolac (TORADOL) 10 MG tablet  Every 6 hours PRN     03/23/20 1720          Portions of this note were generated with dragon dictation software. Dictation errors may occur despite best attempts at proofreading.   Carrie Mew, MD 03/23/20 [phone]

## 2020-03-23 NOTE — Progress Notes (Addendum)
Urology Consult   I have been asked to see the patient by Dr. Joni Fears, for evaluation and management of left ureteral stone.  Chief Complaint: Left renal colic  HPI:  Alison Osborne is a 59 y.o. year old female with extensive history of nephrolithiasis with at least 5 shockwave lithotripsies, and multiple ureteroscopy's in the past.  She presents with 1 to 2 days of worsening left-sided flank and lower abdominal pain, and CT showing an 8 mm left proximal ureteral stone with upstream hydronephrosis, with extensive nephrolithiasis bilaterally.  She denies any dysuria, urgency/frequency, nausea/vomiting, or fevers, chills.  She has been constipated over the last few days which she feels has been contributing to her abdominal pain.  She additionally had some gross hematuria that started 2 days ago.  She denies any history of recurrent UTIs.  Her pain is currently very well controlled after receiving a dose of Toradol.  She is afebrile with stable vital signs.  Urinalysis nitrite negative, trace leukocytes, no bacteria, greater than 50 RBCs, 20-50 WBCs.  WBC 10.8, serum creatinine 0.88, EGFR greater than 60.  She has never undergone metabolic work-up with 70-LIDC urine previously.   PMH: Past Medical History:  Diagnosis Date  . Anxiety   . Cancer ([place])    skin cancer - squamous carcina  . Depression   . Renal disorder    stones    Surgical History: Past Surgical History:  Procedure Laterality Date  . CESAREAN SECTION    . CYSTOSCOPY W/ URETERAL STENT REMOVAL Bilateral 01/16/2016   Procedure: CYSTOSCOPY WITH STENT REMOVAL;  Surgeon: Royston Cowper, MD;  Location: [facility] ORS;  Service: Urology;  Laterality: Bilateral;  . CYSTOSCOPY WITH STENT PLACEMENT Bilateral 11/09/2015   Procedure: CYSTOSCOPY WITH STENT PLACEMENT;  Surgeon: Royston Cowper, MD;  Location: [facility] OR[facility];  Service: Urology;  Laterality: Bilateral;  . EXTRACORPOREAL SHOCK WAVE LITHOTRIPSY Left 11/16/2015   Procedure:  EXTRACORPOREAL SHOCK WAVE LITHOTRIPSY (ESWL);  Surgeon: Royston Cowper, MD;  Location: [facility] OR[facility];  Service: Urology;  Laterality: Left;  . EXTRACORPOREAL SHOCK WAVE LITHOTRIPSY Right 11/30/2015   Procedure: EXTRACORPOREAL SHOCK WAVE LITHOTRIPSY (ESWL);  Surgeon: Royston Cowper, MD;  Location: [facility] OR[facility];  Service: Urology;  Laterality: Right;  . EXTRACORPOREAL SHOCK WAVE LITHOTRIPSY Left 01/04/2016   Procedure: EXTRACORPOREAL SHOCK WAVE LITHOTRIPSY (ESWL);  Surgeon: Royston Cowper, MD;  Location: [facility] ORS;  Service: Urology;  Laterality: Left;  . TUBAL LIGATION      Allergies: No Known Allergies  Family History: Family History  Problem Relation Age of Onset  . Diabetes Mother   . Prostate cancer Father   . Breast cancer Neg Hx     Social History:  reports that she has quit smoking. She has never used smokeless tobacco. She reports current alcohol use of about 4.0 standard drinks of alcohol per week. She reports that she does not use drugs.  ROS: Negative aside from those stated in the HPI.  Physical Exam: BP 128/74 (BP Location: Left Arm)   Pulse 68   Temp 97.8 F (36.6 C)   Resp 16   Ht 5' 8"  (1.727 m)   Wt 75.8 kg   SpO2 96%   BMI 25.39 kg/m    Constitutional:  Alert and oriented, No acute distress. Cardiovascular: Regular rate and rhythm Respiratory: Clear to auscultation bilaterally GI: Abdomen is soft, nontender, nondistended, no abdominal masses GU: Left CVA tenderness  Laboratory Data: Reviewed, see HPI  Pertinent Imaging: I have personally  reviewed the CT from today.  8 mm left proximal ureteral stone(1200HU), multiple renal stones bilaterally  Assessment & Plan:   In summary, she is a 59 year old female with extensive history of nephrolithiasis who presents with 2 days of left-sided flank pain secondary to an 8 mm left proximal ureteral stone.  She has extensive nephrolithiasis bilaterally.  We discussed various treatment options for urolithiasis including  observation with or without medical expulsive therapy, shockwave lithotripsy (SWL), ureteroscopy and laser lithotripsy with stent placement, and percutaneous nephrolithotomy.  We discussed that management is based on stone size, location, density, patient co-morbidities, and patient preference.   Stones <29m in size have a >80% spontaneous passage rate. Data surrounding the use of tamsulosin for medical expulsive therapy is controversial, but meta analyses suggests it is most efficacious for distal stones between 5-186min size. Possible side effects include dizziness/lightheadedness, and retrograde ejaculation.  SWL has a lower stone free rate in a single procedure, but also a lower complication rate compared to ureteroscopy and avoids a stent and associated stent related symptoms. Possible complications include renal hematoma, steinstrasse, and need for additional treatment.  Ureteroscopy with laser lithotripsy and stent placement has a higher stone free rate than SWL in a single procedure, however increased complication rate including possible infection, ureteral injury, bleeding, and stent related morbidity. Common stent related symptoms include dysuria, urgency/frequency, and flank pain.  I recommended ureteroscopy over shockwave lithotripsy secondary to the hardness of her stone, as well as her renal stone burden that can be managed at the same time with ureteroscopy.  We discussed possible need for staged procedure with stent placement and follow-up definitive management if she has any sign of infection after accessing the collecting system.  Okay for discharge from ED tonight with oral Toradol for pain control Will arrange left ureteroscopy, laser lithotripsy, and stent placement for tomorrow   BrBilley CoMD  Total time spent on the floor was 50 minutes, with greater than 50% spent in counseling and coordination of care with the patient regarding extensive history of nephrolithiasis,  and 8 mm left ureteral stone and treatment options  [id number] [phone]

## 2020-03-23 NOTE — ED Triage Notes (Signed)
Pt presents to ED c/o L flank pain and blood in urine. Sent for Urgent Care for evaluation. Reports hx frequent kidney stones. Had UA done at [facility].

## 2020-03-23 NOTE — ED Notes (Signed)
Urine sample sent to lab if needed.

## 2020-03-23 NOTE — Discharge Instructions (Addendum)
OR scheduling does not yet know when your procedure will take place tomorrow.  They will call you around 8:00am or 9:00am tomorrow to explain what time to arrive at the hospital for your procedure tomorrow.

## 2020-03-24 ENCOUNTER — Encounter
Admission: RE | Disposition: A | Discharge status: Home / Self Care | Payer: Self-pay | Source: Home / Self Care | Attending: Urology

## 2020-03-24 ENCOUNTER — Ambulatory Visit: Payer: BC Managed Care – PPO | Admitting: Certified Registered Nurse Anesthetist

## 2020-03-24 ENCOUNTER — Encounter: Payer: Self-pay | Admitting: Urology

## 2020-03-24 ENCOUNTER — Other Ambulatory Visit: Payer: Self-pay | Admitting: Urology

## 2020-03-24 ENCOUNTER — Ambulatory Visit: Payer: BC Managed Care – PPO

## 2020-03-24 ENCOUNTER — Ambulatory Visit
Admission: RE | Admit: 2020-03-24 | Discharge: 2020-03-24 | Disposition: A | Discharge status: Home / Self Care | Payer: BC Managed Care – PPO | Attending: Urology | Admitting: Urology

## 2020-03-24 DIAGNOSIS — N201 Calculus of ureter: Secondary | ICD-10-CM

## 2020-03-24 DIAGNOSIS — Z87891 Personal history of nicotine dependence: Secondary | ICD-10-CM | POA: Insufficient documentation

## 2020-03-24 DIAGNOSIS — F329 Major depressive disorder, single episode, unspecified: Secondary | ICD-10-CM | POA: Diagnosis not present

## 2020-03-24 DIAGNOSIS — F419 Anxiety disorder, unspecified: Secondary | ICD-10-CM | POA: Diagnosis not present

## 2020-03-24 DIAGNOSIS — N202 Calculus of kidney with calculus of ureter: Secondary | ICD-10-CM

## 2020-03-24 DIAGNOSIS — Z85828 Personal history of other malignant neoplasm of skin: Secondary | ICD-10-CM | POA: Diagnosis not present

## 2020-03-24 HISTORY — PX: CYSTOSCOPY/URETEROSCOPY/HOLMIUM LASER/STENT PLACEMENT: SHX6546

## 2020-03-24 SURGERY — CYSTOSCOPY/URETEROSCOPY/HOLMIUM LASER/STENT PLACEMENT
Anesthesia: General | Laterality: Left

## 2020-03-24 MED ORDER — FAMOTIDINE 20 MG PO TABS
ORAL_TABLET | ORAL | Status: AC
  Administered 2020-03-24: 11:00:00 20 mg via ORAL
  Filled 2020-03-24: qty 1

## 2020-03-24 MED ORDER — IOHEXOL 180 MG/ML  SOLN
INTRAMUSCULAR | Status: DC | PRN
  Administered 2020-03-24: 20 mL

## 2020-03-24 MED ORDER — FENTANYL CITRATE (PF) 100 MCG/2ML IJ SOLN
INTRAMUSCULAR | Status: AC
  Filled 2020-03-24: qty 2

## 2020-03-24 MED ORDER — MIDAZOLAM HCL 2 MG/2ML IJ SOLN
INTRAMUSCULAR | Status: AC
  Filled 2020-03-24: qty 2

## 2020-03-24 MED ORDER — BELLADONNA ALKALOIDS-OPIUM 16.2-60 MG RE SUPP
RECTAL | Status: AC
  Filled 2020-03-24: qty 1

## 2020-03-24 MED ORDER — LACTATED RINGERS IV SOLN: Freq: Once | INTRAVENOUS | Status: AC

## 2020-03-24 MED ORDER — LACTATED RINGERS IV SOLN: INTRAVENOUS | Status: DC | PRN

## 2020-03-24 MED ORDER — HYDROCODONE-ACETAMINOPHEN 5-325 MG PO TABS: 1.0000 | ORAL_TABLET | ORAL | 0 refills | Status: AC | PRN

## 2020-03-24 MED ORDER — PHENYLEPHRINE HCL (PRESSORS) 10 MG/ML IV SOLN
INTRAVENOUS | Status: DC | PRN
  Administered 2020-03-24 (×5): 100 ug via INTRAVENOUS

## 2020-03-24 MED ORDER — SULFAMETHOXAZOLE-TRIMETHOPRIM 800-160 MG PO TABS
1.0000 | ORAL_TABLET | Freq: Every day | ORAL | 0 refills | Status: DC
Start: 2020-03-24 — End: 2020-04-03

## 2020-03-24 MED ORDER — TAMSULOSIN HCL 0.4 MG PO CAPS
0.4000 mg | ORAL_CAPSULE | Freq: Every day | ORAL | 0 refills | Status: DC
Start: 2020-03-24 — End: 2020-06-07

## 2020-03-24 MED ORDER — ACETAMINOPHEN 10 MG/ML IV SOLN: 1000.0000 mg | Freq: Once | INTRAVENOUS | Status: DC | PRN

## 2020-03-24 MED ORDER — ONDANSETRON HCL 4 MG/2ML IJ SOLN: 4.0000 mg | Freq: Once | INTRAMUSCULAR | Status: DC | PRN

## 2020-03-24 MED ORDER — BELLADONNA ALKALOIDS-OPIUM 16.2-60 MG RE SUPP
RECTAL | Status: DC | PRN
  Administered 2020-03-24: 1 via RECTAL

## 2020-03-24 MED ORDER — DEXAMETHASONE SODIUM PHOSPHATE 10 MG/ML IJ SOLN
INTRAMUSCULAR | Status: DC | PRN
  Administered 2020-03-24: 10 mg via INTRAVENOUS

## 2020-03-24 MED ORDER — OXYCODONE HCL 5 MG/5ML PO SOLN: 5.0000 mg | Freq: Once | ORAL | Status: DC | PRN

## 2020-03-24 MED ORDER — SUGAMMADEX SODIUM 200 MG/2ML IV SOLN
INTRAVENOUS | Status: DC | PRN
  Administered 2020-03-24: 200 mg via INTRAVENOUS

## 2020-03-24 MED ORDER — FENTANYL CITRATE (PF) 100 MCG/2ML IJ SOLN
INTRAMUSCULAR | Status: DC | PRN
  Administered 2020-03-24 (×2): 50 ug via INTRAVENOUS

## 2020-03-24 MED ORDER — ROCURONIUM BROMIDE 100 MG/10ML IV SOLN
INTRAVENOUS | Status: DC | PRN
  Administered 2020-03-24: 50 mg via INTRAVENOUS
  Administered 2020-03-24: 10 mg via INTRAVENOUS

## 2020-03-24 MED ORDER — FAMOTIDINE 20 MG PO TABS: 20.0000 mg | ORAL_TABLET | Freq: Once | ORAL | Status: AC

## 2020-03-24 MED ORDER — CEFAZOLIN SODIUM-DEXTROSE 2-4 GM/100ML-% IV SOLN
2.0000 g | INTRAVENOUS | Status: AC
  Administered 2020-03-24: 13:00:00 2 g via INTRAVENOUS

## 2020-03-24 MED ORDER — FENTANYL CITRATE (PF) 100 MCG/2ML IJ SOLN: 25.0000 ug | INTRAMUSCULAR | Status: DC | PRN

## 2020-03-24 MED ORDER — OXYCODONE HCL 5 MG PO TABS: 5.0000 mg | ORAL_TABLET | Freq: Once | ORAL | Status: DC | PRN

## 2020-03-24 MED ORDER — OXYBUTYNIN CHLORIDE ER 10 MG PO TB24
10.0000 mg | ORAL_TABLET | Freq: Every day | ORAL | 0 refills | Status: AC | PRN
Start: 2020-03-24 — End: 2020-04-07

## 2020-03-24 MED ORDER — MIDAZOLAM HCL 2 MG/2ML IJ SOLN
INTRAMUSCULAR | Status: DC | PRN
  Administered 2020-03-24: 2 mg via INTRAVENOUS

## 2020-03-24 MED ORDER — PROPOFOL 10 MG/ML IV BOLUS
INTRAVENOUS | Status: DC | PRN
  Administered 2020-03-24: 150 mg via INTRAVENOUS

## 2020-03-24 MED ORDER — KETOROLAC TROMETHAMINE 30 MG/ML IJ SOLN
INTRAMUSCULAR | Status: DC | PRN
  Administered 2020-03-24: 15 mg via INTRAVENOUS

## 2020-03-24 MED ORDER — PROPOFOL 10 MG/ML IV BOLUS
INTRAVENOUS | Status: AC
  Filled 2020-03-24: qty 20

## 2020-03-24 MED ORDER — ONDANSETRON HCL 4 MG/2ML IJ SOLN
INTRAMUSCULAR | Status: DC | PRN
  Administered 2020-03-24: 4 mg via INTRAVENOUS

## 2020-03-24 SURGICAL SUPPLY — 32 items
BAG DRAIN CYSTO-URO LG1000N (MISCELLANEOUS) ×2 IMPLANT
BAG URINE DRAIN 2000ML AR STRL (UROLOGICAL SUPPLIES) ×1 IMPLANT
BRUSH SCRUB EZ 1% IODOPHOR (MISCELLANEOUS) ×2 IMPLANT
CATH FOL 2WAY LX 16X30 (CATHETERS) ×1 IMPLANT
CATH URETL 5X70 OPEN END (CATHETERS) ×1 IMPLANT
CNTNR SPEC 2.5X3XGRAD LEK (MISCELLANEOUS) ×1
CONT SPEC 4OZ STER OR WHT (MISCELLANEOUS) ×1
CONTAINER SPEC 2.5X3XGRAD LEK (MISCELLANEOUS) IMPLANT
DRAPE UTILITY 15X26 TOWEL STRL (DRAPES) ×2 IMPLANT
FIBER LASER TRAC TIP (UROLOGICAL SUPPLIES) ×1 IMPLANT
GLOVE BIOGEL PI IND STRL 7.5 (GLOVE) ×1 IMPLANT
GLOVE BIOGEL PI INDICATOR 7.5 (GLOVE) ×1
GOWN STRL REUS W/ TWL LRG LVL3 (GOWN DISPOSABLE) ×1 IMPLANT
GOWN STRL REUS W/ TWL XL LVL3 (GOWN DISPOSABLE) ×1 IMPLANT
GOWN STRL REUS W/TWL LRG LVL3 (GOWN DISPOSABLE) ×1
GOWN STRL REUS W/TWL XL LVL3 (GOWN DISPOSABLE) ×1
GUIDEWIRE STR DUAL SENSOR (WIRE) ×3 IMPLANT
HOLDER FOLEY CATH W/STRAP (MISCELLANEOUS) ×1 IMPLANT
INFUSOR MANOMETER BAG 3000ML (MISCELLANEOUS) ×2 IMPLANT
INTRODUCER DILATOR DOUBLE (INTRODUCER) ×1 IMPLANT
KIT TURNOVER CYSTO (KITS) ×2 IMPLANT
PACK CYSTO AR (MISCELLANEOUS) ×2 IMPLANT
SET CYSTO W/LG BORE CLAMP LF (SET/KITS/TRAYS/PACK) ×2 IMPLANT
SHEATH URETERAL 12FRX35CM (MISCELLANEOUS) IMPLANT
SOL .9 NS 3000ML IRR  AL (IV SOLUTION) ×1
SOL .9 NS 3000ML IRR UROMATIC (IV SOLUTION) ×1 IMPLANT
STENT URET 6FRX24 CONTOUR (STENTS) IMPLANT
STENT URET 6FRX26 CONTOUR (STENTS) ×1 IMPLANT
SURGILUBE 2OZ TUBE FLIPTOP (MISCELLANEOUS) ×1 IMPLANT
SYR 10ML LL (SYRINGE) ×1 IMPLANT
VALVE UROSEAL ADJ ENDO (VALVE) ×1 IMPLANT
WATER STERILE IRR 1000ML POUR (IV SOLUTION) ×2 IMPLANT

## 2020-03-24 NOTE — H&P (Signed)
UROLOGY H&P UPDATE  Agree with prior H&P dated 03/23/2020.  8 mm left proximal ureteral stone with numerous renal stones.  Cardiac: RRR Lungs: CTA bilaterally  Laterality: Left Procedure: Left ureteroscopy, laser lithotripsy, stent placement  Urine: Urinalysis 4/22 no bacteria, nitrite negative, trace leukocytes, greater than 50 RBCs, 20-50 WBCs.  Culture pending  We specifically discussed the risks ureteroscopy including bleeding, infection/sepsis, stent related symptoms including flank pain/urgency/frequency/incontinence/dysuria, ureteral injury, inability to access stone, or need for staged or additional procedures.  We discussed the risk of possible infection which would necessitate temporary stent placement and follow-up for delayed management.   Billey Co, MD 03/24/2020

## 2020-03-24 NOTE — Anesthesia Procedure Notes (Signed)
Procedure Name: Intubation Date/Time: 03/24/2020 1:02 PM Performed by: Louann Sjogren, CRNA Pre-anesthesia Checklist: Patient identified, Patient being monitored, Timeout performed, Emergency Drugs available and Suction available Patient Re-evaluated:Patient Re-evaluated prior to induction Oxygen Delivery Method: Circle system utilized Preoxygenation: Pre-oxygenation with 100% oxygen Induction Type: IV induction Ventilation: Mask ventilation without difficulty Laryngoscope Size: Mac and 4 Grade View: Grade I Tube type: Oral Tube size: 7.5 mm Number of attempts: 1 Airway Equipment and Method: Stylet Placement Confirmation: ETT inserted through vocal cords under direct vision,  positive ETCO2 and breath sounds checked- equal and bilateral Secured at: 21 cm Tube secured with: Tape Dental Injury: Teeth and Oropharynx as per pre-operative assessment

## 2020-03-24 NOTE — Anesthesia Postprocedure Evaluation (Signed)
Anesthesia Post Note  Patient: Alison Osborne  Procedure(s) Performed: CYSTOSCOPY/URETEROSCOPY/HOLMIUM LASER/STENT PLACEMENT (Left )  Patient location during evaluation: PACU Anesthesia Type: General Level of consciousness: awake and alert Pain management: pain level controlled Vital Signs Assessment: post-procedure vital signs reviewed and stable Respiratory status: spontaneous breathing, nonlabored ventilation, respiratory function stable and patient connected to nasal cannula oxygen Cardiovascular status: blood pressure returned to baseline and stable Postop Assessment: no apparent nausea or vomiting Anesthetic complications: no     Last Vitals:  Vitals:   03/24/20 1420 03/24/20 1435  BP: 118/78 107/66  Pulse: 72 72  Resp: 19 14  Temp:  37.2 C  SpO2: 99% 97%    Last Pain:  Vitals:   03/24/20 1435  TempSrc:   PainSc: 0-No pain                 Arita Miss

## 2020-03-24 NOTE — Transfer of Care (Signed)
Immediate Anesthesia Transfer of Care Note  Patient: Alison Osborne  Procedure(s) Performed: CYSTOSCOPY/URETEROSCOPY/HOLMIUM LASER/STENT PLACEMENT (Left )  Patient Location: PACU  Anesthesia Type:General  Level of Consciousness: awake and alert   Airway & Oxygen Therapy: Patient Spontanous Breathing  Post-op Assessment: Report given to RN  Post vital signs: Reviewed and stable  Last Vitals:  Vitals Value Taken Time  BP 118/77 03/24/20 1408  Temp 36.6 C 03/24/20 1405  Pulse 78 03/24/20 1408  Resp 18 03/24/20 1408  SpO2 98 % 03/24/20 1408    Last Pain:  Vitals:   03/24/20 1405  TempSrc:   PainSc: 0-No pain         Complications: No apparent anesthesia complications

## 2020-03-24 NOTE — Discharge Instructions (Signed)

## 2020-03-24 NOTE — Anesthesia Preprocedure Evaluation (Signed)
Anesthesia Evaluation  Patient identified by MRN, date of birth, ID band Patient awake    Reviewed: Allergy & Precautions, H&P , NPO status , Patient's Chart, lab work & pertinent test results, reviewed documented beta blocker date and time   History of Anesthesia Complications Negative for: history of anesthetic complications  Airway Mallampati: II  TM Distance: >3 FB Neck ROM: full    Dental no notable dental hx. (+) Teeth Intact   Pulmonary neg pulmonary ROS, neg sleep apnea, neg COPD, Patient abstained from smoking.Not current smoker, former smoker,    Pulmonary exam normal breath sounds clear to auscultation       Cardiovascular Exercise Tolerance: Good METS(-) hypertension(-) CAD and (-) Past MI negative cardio ROS Normal cardiovascular exam(-) dysrhythmias  Rhythm:Regular Rate:Normal - Systolic murmurs    Neuro/Psych PSYCHIATRIC DISORDERS Anxiety Depression negative neurological ROS     GI/Hepatic negative GI ROS, Neg liver ROS, neg GERD  ,  Endo/Other  negative endocrine ROSneg diabetes  Renal/GU Renal diseasenegative Renal ROSKidney stones  negative genitourinary   Musculoskeletal   Abdominal   Peds  Hematology negative hematology ROS (+)   Anesthesia Other Findings Past Medical History: No date: Anxiety No date: Cancer ([place])     Comment:  skin cancer - squamous carcina No date: Depression No date: Renal disorder     Comment:  stones  Reproductive/Obstetrics negative OB ROS                             Anesthesia Physical  Anesthesia Plan  ASA: II  Anesthesia Plan: General   Post-op Pain Management:    Induction: Intravenous  PONV Risk Score and Plan: 4 or greater and Ondansetron, Dexamethasone and Midazolam  Airway Management Planned: Oral ETT  Additional Equipment: None  Intra-op Plan:   Post-operative Plan: Extubation in OR  Informed Consent: I have  reviewed the patients History and Physical, chart, labs and discussed the procedure including the risks, benefits and alternatives for the proposed anesthesia with the patient or authorized representative who has indicated his/her understanding and acceptance.     Dental advisory given  Plan Discussed with: CRNA  Anesthesia Plan Comments: (Discussed risks of anesthesia with patient, including PONV, sore throat, lip/dental damage. Rare risks discussed as well, such as cardiorespiratory and neurological sequelae. Patient understands.)        Anesthesia Quick Evaluation

## 2020-03-24 NOTE — Op Note (Signed)
Date of procedure: 03/24/20  Preoperative diagnosis:  1. Left proximal ureteral stone 2. Left renal stones  Postoperative diagnosis:  1. Left proximal ureteral stone 2. Left renal stones  Procedure: 1. Cystoscopy, left retrograde pyelogram with intraoperative interpretation 2. Left ureteroscopy and laser lithotripsy of proximal ureteral stone 3. Left ureteroscopy and laser lithotripsy of multiple left renal stones 4. Left ureteral stent placement  Surgeon: Nickolas Madrid, MD  Anesthesia: General  Complications: None  Intraoperative findings:  1.  Normal cystoscopy 2.  Impacted left proximal ureteral stone dusted 3.  Multiple ~82mm left renal stones dusted 4.  Uncomplicated stent placement  EBL: None  Specimens: Stone for analysis  Drains: Left 6 French by 26 cm ureteral stent  Indication: Alison Osborne is a 59 y.o. patient with extensive history of stone disease who presented to the ED yesterday with poorly controlled left flank pain.  She was afebrile with no leukocytosis and urinalysis showed no bacteria and she elected for definitive treatment today.  After reviewing the management options for treatment, they elected to proceed with the above surgical procedure(s). We have discussed the potential benefits and risks of the procedure, side effects of the proposed treatment, the likelihood of the patient achieving the goals of the procedure, and any potential problems that might occur during the procedure or recuperation. Informed consent has been obtained.  Description of procedure:  The patient was taken to the operating room and general anesthesia was induced. SCDs were placed for DVT prophylaxis. The patient was placed in the dorsal lithotomy position, prepped and draped in the usual sterile fashion, and preoperative antibiotics were administered. A preoperative time-out was performed.   11 French rigid cystoscope was used to intubate the urethra and thorough cystoscopy  was performed.  There were no abnormal bladder lesions, and the ureteral orifices were orthotopic bilaterally.  The left proximal ureteral stone and renal stones could clearly be seen on fluoroscopy.  The left retrograde pyelogram was performed with a 5 [place] access catheter and the ureter was dilated below the stone, with no contrast passing into the collecting system.  With the aid of the access catheter, I was able to navigate a sensor wire alongside the stone up into the kidney.  A dual-lumen access catheter was then added to use a second safety sensor wire.  A 12/14 French by 35 cm ureteral access sheath was gently advanced over the wire under fluoroscopic vision up to the level of the stone.  A single channel flexible ureteroscope was advanced through the sheath up to the stone.  The ureter was quite tortuous, and the stone appeared impacted.  A 200 m laser fiber on settings of 0.5 J and 20 Hz was used to carefully dust the stone.    Once the proximal ureteral stone was completely treated, then was able to advance the scope up into the kidney and thorough pyeloscopy was performed.  There were multiple 5 mm stones in the midpole and lower poles, and these were dusted on the aforementioned settings.  Thorough pyeloscopy at the conclusion of the case revealed no residual fragments.  A retrograde pyelogram performed from the proximal ureter showed no extravasation or filling defects.  Careful pullback ureteroscopy revealed no residual fragments or ureteral injury.  There was some edema at the proximal ureter where the stone was impacted, but no ureteral injury.  A 6 French by 26 cm stent was advanced over the wire and there was an excellent curl in the renal pelvis, as  well as in the bladder.  Cystoscopy was performed and stone fragments were sent for analysis.  There was brisk drainage of contrast through the side ports of the stent.  A 16 French Foley was placed to maximize drainage postop, and will be  removed prior to discharge.  Disposition: Stable to PACU  Plan: Remove Foley prior to discharge Follow-up in 1 to 2 weeks for stent removal Bactrim prophylaxis while stent in place Will need follow-up renal ultrasound in 6 weeks, as well as [id number] urine metabolic work-up  Nickolas Madrid, MD

## 2020-03-26 LAB — URINE CULTURE: Culture: >=100000 — AB

## 2020-03-27 ENCOUNTER — Telehealth: Payer: Self-pay | Admitting: Urology

## 2020-03-27 MED ORDER — AMOXICILLIN 500 MG PO CAPS
500.0000 mg | ORAL_CAPSULE | Freq: Two times a day (BID) | ORAL | 0 refills | Status: DC
Start: 2020-03-27 — End: 2020-06-07

## 2020-03-27 NOTE — Telephone Encounter (Signed)
-----   Message from Billey Co, MD sent at 03/24/2020  2:26 PM EDT ----- Regarding: stent removal Follow up for clinic stent removal in 8-14 days, thanks. Ok to [place], MD 03/24/2020

## 2020-03-27 NOTE — Telephone Encounter (Signed)
Please put her on amoxicillin 500mg  BID x 7 days, can stop the prophylactic bactrim. Thanks  Nickolas Madrid, MD 03/27/2020

## 2020-03-27 NOTE — Telephone Encounter (Signed)
done

## 2020-03-27 NOTE — Telephone Encounter (Signed)
Patient was notified, she states she is taking bactrim. She was told to discontinue and only take the amoxicillin

## 2020-03-27 NOTE — Telephone Encounter (Signed)
Patient called today wanting to know if she should be started on abx. She was notified by mychart that her urine culture was positive. Please advise

## 2020-03-27 NOTE — Progress Notes (Signed)
Brief Pharmacy Note  Patient presented to ED with left flank pain and blood in urine 4/22. Had procedure with Dr. Diamantina Providence 4/23 for stent placement. Does not appear to have received antibiotics. Pharmacist reviewed ED Culture report. Patient with > 100,000 colonies E.faecalis, pan-sensitive. Per chart review, patient to follow up at [facility] for stent removal.  Call made to [facility] and voicemail left on clinical line. Above information relayed with call back number for pharmacist if any questions.  Dorena Bodo, PharmD

## 2020-03-30 LAB — CALCULI, WITH PHOTOGRAPH (CLINICAL LAB)
Calcium Oxalate Dihydrate: 70 %
Calcium Oxalate Monohydrate: 25 %
Hydroxyapatite: 5 %
Weight Calculi: 2 mg

## 2020-04-03 ENCOUNTER — Encounter: Payer: Self-pay | Admitting: Urology

## 2020-04-03 ENCOUNTER — Ambulatory Visit (INDEPENDENT_AMBULATORY_CARE_PROVIDER_SITE_OTHER): Payer: BC Managed Care – PPO | Admitting: Urology

## 2020-04-03 VITALS — BP 136/84 | HR 77

## 2020-04-03 DIAGNOSIS — Z9889 Other specified postprocedural states: Secondary | ICD-10-CM | POA: Diagnosis not present

## 2020-04-03 LAB — MICROSCOPIC EXAMINATION

## 2020-04-03 LAB — URINALYSIS, COMPLETE
Bilirubin, UA: NEGATIVE
Glucose, UA: NEGATIVE
Ketones, UA: NEGATIVE
Nitrite, UA: NEGATIVE
Protein,UA: NEGATIVE
Specific Gravity, UA: 1.015 (ref 1.005–1.030)
Urobilinogen, Ur: 0.2 mg/dL (ref 0.2–1.0)
pH, UA: 5 (ref 5.0–7.5)

## 2020-04-03 NOTE — Progress Notes (Signed)
Indications: Patient is 59 y.o., female status post left ureteroscopy with laser lithotripsy of a proximal ureteral calculus and multiple renal calculi on 03/24/2020.  She had no postoperative problems and the patient is presenting today for stent removal.  Urinalysis today shows no evidence of infection  Procedure:  Flexible Cystoscopy with stent removal [id number])  Timeout was performed and the correct patient, procedure and participants were identified.    Description:  The patient was prepped and draped in the usual sterile fashion. Flexible cystosopy was performed.  The stent was visualized, grasped, and removed intact without difficulty. The patient tolerated the procedure well.  She is presently on amoxicillin  Complications:  None  Plan: Instructed to call for flank pain or fever greater than 101 degrees.  She has a follow-up scheduled with Dr. Diamantina Providence 06/07/2020  John Giovanni, MD

## 2020-04-05 ENCOUNTER — Encounter: Payer: Self-pay | Admitting: Urology

## 2020-04-05 ENCOUNTER — Telehealth: Payer: Self-pay

## 2020-04-05 NOTE — Telephone Encounter (Signed)
-----   Message from Billey Co, MD sent at 03/31/2020 11:20 AM EDT ----- Regarding: follow up Patient of mine with recurrent stone disease and recent ureteroscopy/laser/stent, she is having stent removed with [facility] this Monday bc she has to leave town next week.  Tracy/Oki- Please schedule follow up with me in 6 weeks with renal US prior, and 24 hour urine collection that she can collect ~3 weeks from now so we have the results  [facility]- FYI  Thanks Nickolas Madrid, MD 03/31/2020

## 2020-04-05 NOTE — Telephone Encounter (Signed)
[facility]ol[facility] ordered. Instructions sent via mychart.

## 2020-05-09 ENCOUNTER — Other Ambulatory Visit: Payer: Self-pay

## 2020-05-09 ENCOUNTER — Other Ambulatory Visit: Payer: BC Managed Care – PPO

## 2020-05-16 ENCOUNTER — Other Ambulatory Visit: Payer: Self-pay | Admitting: Urology

## 2020-05-16 DIAGNOSIS — N200 Calculus of kidney: Secondary | ICD-10-CM

## 2020-06-07 ENCOUNTER — Encounter: Payer: Self-pay | Admitting: Urology

## 2020-06-07 ENCOUNTER — Other Ambulatory Visit: Payer: Self-pay

## 2020-06-07 ENCOUNTER — Ambulatory Visit: Payer: BC Managed Care – PPO | Admitting: Urology

## 2020-06-07 VITALS — BP 146/87 | HR 64 | Ht 68.0 in | Wt 167.0 lb

## 2020-06-07 DIAGNOSIS — Z85828 Personal history of other malignant neoplasm of skin: Secondary | ICD-10-CM | POA: Insufficient documentation

## 2020-06-07 DIAGNOSIS — N200 Calculus of kidney: Secondary | ICD-10-CM | POA: Insufficient documentation

## 2020-06-07 NOTE — Progress Notes (Signed)
° °  06/07/2020 3:59 PM   Alison Osborne 01-10-1961 [id number]  Reason for visit: Follow up nephrolithiasis  HPI: I saw [name] back in urology clinic for follow-up of nephrolithiasis.  She is a 59 year old female with a history of recurrent stone disease who underwent left ureteroscopy, laser lithotripsy, and stent placement on 03/24/2020 for an impacted 8 mm left proximal ureteral stone and multiple left renal stones.  Her stent was removed on 04/03/2020.  She did not complete the 6-week postop ultrasound.  Stone type was calcium oxalate.  She completed a 41-SELT metabolic work-up which was notable for low urine volume of 1.5 L, mildly elevated urine calcium of 203, significantly elevated urine sodium of 190, excellent urine citrate of 737, normal urine oxalate of 27.  We reviewed these results at length today.  I recommended focusing on increasing urine volume over 2.5 L, decreasing calcium in the urine by decreasing sodium in the diet, and continuing high citrate intake.  Encouraged to complete renal ultrasound, will call with those results RTC 1 year with KUB prior  Billey Co, MD  [facility] [address] [place], [place] [address] [phone]

## 2020-06-07 NOTE — Patient Instructions (Signed)
1.  Increase fluid intake, goal is 2.5 L of urine output per day.  Your urine should be clear.  The easiest way to increase fluid intake is to have a set goal daily intake like 80-100oz, or purchase a large bottle and drink 1 to 2/day 2.  Decrease salt in the diet.  Increase salt pulse extra calcium into the urine which forms kidney stones.  Dietary Guidelines to Help Prevent Kidney Stones Kidney stones are deposits of minerals and salts that form inside your kidneys. Your risk of developing kidney stones may be greater depending on your diet, your lifestyle, the medicines you take, and whether you have certain medical conditions. Most people can reduce their chances of developing kidney stones by following the instructions below. Depending on your overall health and the type of kidney stones you tend to develop, your dietitian may give you more specific instructions. What are tips for following this plan? Reading food labels  Choose foods with "no salt added" or "low-salt" labels. Limit your sodium intake to less than 1500 mg per day.  Choose foods with calcium for each meal and snack. Try to eat about 300 mg of calcium at each meal. Foods that contain 200-500 mg of calcium per serving include: ? 8 oz (237 ml) of milk, fortified nondairy milk, and fortified fruit juice. ? 8 oz (237 ml) of kefir, yogurt, and soy yogurt. ? 4 oz (118 ml) of tofu. ? 1 oz of cheese. ? 1 cup (300 g) of dried figs. ? 1 cup (91 g) of cooked broccoli. ? 1-3 oz can of sardines or mackerel.  Most people need 1000 to 1500 mg of calcium each day. Talk to your dietitian about how much calcium is recommended for you. Shopping  Buy plenty of fresh fruits and vegetables. Most people do not need to avoid fruits and vegetables, even if they contain nutrients that may contribute to kidney stones.  When shopping for convenience foods, choose: ? Whole pieces of fruit. ? Premade salads with dressing on the side. ? Low-fat fruit  and yogurt smoothies.  Avoid buying frozen meals or prepared deli foods.  Look for foods with live cultures, such as yogurt and kefir. Cooking  Do not add salt to food when cooking. Place a salt shaker on the table and allow each person to add his or her own salt to taste.  Use vegetable protein, such as beans, textured vegetable protein (TVP), or tofu instead of meat in pasta, casseroles, and soups. Meal planning   Eat less salt, if told by your dietitian. To do this: ? Avoid eating processed or premade food. ? Avoid eating fast food.  Eat less animal protein, including cheese, meat, poultry, or fish, if told by your dietitian. To do this: ? Limit the number of times you have meat, poultry, fish, or cheese each week. Eat a diet free of meat at least 2 days a week. ? Eat only one serving each day of meat, poultry, fish, or seafood. ? When you prepare animal protein, cut pieces into small portion sizes. For most meat and fish, one serving is about the size of one deck of cards.  Eat at least 5 servings of fresh fruits and vegetables each day. To do this: ? Keep fruits and vegetables on hand for snacks. ? Eat 1 piece of fruit or a handful of berries with breakfast. ? Have a salad and fruit at lunch. ? Have two kinds of vegetables at dinner.  Limit foods  that are high in a substance called oxalate. These include: ? Spinach. ? Rhubarb. ? Beets. ? Potato chips and french fries. ? Nuts.  If you regularly take a diuretic medicine, make sure to eat at least 1-2 fruits or vegetables high in potassium each day. These include: ? Avocado. ? Banana. ? Orange, prune, carrot, or tomato juice. ? Baked potato. ? Cabbage. ? Beans and split peas. General instructions   Drink enough fluid to keep your urine clear or pale yellow. This is the most important thing you can do.  Talk to your health care provider and dietitian about taking daily supplements. Depending on your health and the  cause of your kidney stones, you may be advised: ? Not to take supplements with vitamin C. ? To take a calcium supplement. ? To take a daily probiotic supplement. ? To take other supplements such as magnesium, fish oil, or vitamin B6.  Take all medicines and supplements as told by your health care provider.  Limit alcohol intake to no more than 1 drink a day for nonpregnant women and 2 drinks a day for men. One drink equals 12 oz of beer, 5 oz of wine, or 1 oz of hard liquor.  Lose weight if told by your health care provider. Work with your dietitian to find strategies and an eating plan that works best for you. What foods are not recommended? Limit your intake of the following foods, or as told by your dietitian. Talk to your dietitian about specific foods you should avoid based on the type of kidney stones and your overall health. Grains Breads. Bagels. Rolls. Baked goods. Salted crackers. Cereal. Pasta. Vegetables Spinach. Rhubarb. Beets. Canned vegetables. Alison Osborne. Olives. Meats and other protein foods Nuts. Nut butters. Large portions of meat, poultry, or fish. Salted or cured meats. Deli meats. Hot dogs. Sausages. Dairy Cheese. Beverages Regular soft drinks. Regular vegetable juice. Seasonings and other foods Seasoning blends with salt. Salad dressings. Canned soups. Soy sauce. Ketchup. Barbecue sauce. Canned pasta sauce. Casseroles. Pizza. Lasagna. Frozen meals. Potato chips. [place] fries. Summary  You can reduce your risk of kidney stones by making changes to your diet.  The most important thing you can do is drink enough fluid. You should drink enough fluid to keep your urine clear or pale yellow.  Ask your health care provider or dietitian how much protein from animal sources you should eat each day, and also how much salt and calcium you should have each day. This information is not intended to replace advice given to you by your health care provider. Make sure you discuss  any questions you have with your health care provider. Document Revised: 03/10/2019 Document Reviewed: 10/29/2016 Elsevier Patient Education  2020 [employer].

## 2020-07-06 ENCOUNTER — Other Ambulatory Visit: Payer: Self-pay

## 2020-07-06 ENCOUNTER — Ambulatory Visit
Admission: RE | Admit: 2020-07-06 | Discharge: 2020-07-06 | Disposition: A | Discharge status: Home / Self Care | Payer: BC Managed Care – PPO | Source: Ambulatory Visit | Attending: Urology | Admitting: Urology

## 2020-07-06 DIAGNOSIS — N200 Calculus of kidney: Secondary | ICD-10-CM | POA: Diagnosis not present

## 2020-07-07 ENCOUNTER — Telehealth: Payer: Self-pay

## 2020-07-07 NOTE — Telephone Encounter (Signed)
See my chart message

## 2020-07-07 NOTE — Telephone Encounter (Signed)
-----   Message from Billey Co, MD sent at 07/07/2020 10:21 AM EDT ----- Renal US looks good, no hydronephrosis/swelling after ureteroscopy, follow up as scheduled one year  Nickolas Madrid, MD 07/07/2020

## 2020-09-11 ENCOUNTER — Ambulatory Visit: Payer: BC Managed Care – PPO | Attending: Internal Medicine

## 2020-09-11 DIAGNOSIS — Z23 Encounter for immunization: Secondary | ICD-10-CM

## 2020-09-11 NOTE — Progress Notes (Signed)
   Covid-19 Vaccination Clinic  Name:  Alison Osborne    MRN: [id number] DOB: 10/06/1961  09/11/2020  [name] was observed post Covid-19 immunization for 15 minutes without incident. She was provided with Vaccine Information Sheet and instruction to access the V-Safe system.   [name] was instructed to call 911 with any severe reactions post vaccine: Marland Kitchen Difficulty breathing  . Swelling of face and throat  . A fast heartbeat  . A bad rash all over body  . Dizziness and weakness

## 2021-06-06 ENCOUNTER — Other Ambulatory Visit: Payer: Self-pay | Admitting: Family Medicine

## 2021-06-06 DIAGNOSIS — Z1231 Encounter for screening mammogram for malignant neoplasm of breast: Secondary | ICD-10-CM

## 2021-06-07 ENCOUNTER — Ambulatory Visit: Payer: BC Managed Care – PPO | Admitting: Urology

## 2021-06-08 ENCOUNTER — Ambulatory Visit: Payer: BC Managed Care – PPO | Admitting: Urology

## 2021-06-14 ENCOUNTER — Other Ambulatory Visit: Payer: Self-pay

## 2021-06-14 ENCOUNTER — Ambulatory Visit
Admission: RE | Admit: 2021-06-14 | Discharge: 2021-06-14 | Disposition: A | Discharge status: Home / Self Care | Payer: BC Managed Care – PPO | Source: Ambulatory Visit | Attending: Urology | Admitting: Urology

## 2021-06-14 ENCOUNTER — Ambulatory Visit: Payer: BC Managed Care – PPO | Admitting: Urology

## 2021-06-14 ENCOUNTER — Encounter: Payer: Self-pay | Admitting: Urology

## 2021-06-14 VITALS — BP 121/84 | HR 80 | Ht 68.0 in | Wt 163.3 lb

## 2021-06-14 DIAGNOSIS — Z87442 Personal history of urinary calculi: Secondary | ICD-10-CM

## 2021-06-14 DIAGNOSIS — N200 Calculus of kidney: Secondary | ICD-10-CM

## 2021-06-14 NOTE — Patient Instructions (Signed)
Dietary Guidelines to Help Prevent Kidney Stones Kidney stones are deposits of minerals and salts that form inside your kidneys. Your risk of developing kidney stones may be greater depending on your diet, your lifestyle, the medicines you take, and whether you have certain medical conditions. Most people can lower their chances of developing kidney stones by following the instructions below. Your dietitian may give you more specific instructions depending on your overall health and the type of kidney stones you tend to develop. What are tips for following this plan? Reading food labels  Choose foods with "no salt added" or "low-salt" labels. Limit your salt (sodium) intake to less than 1,500 mg a day. Choose foods with calcium for each meal and snack. Try to eat about 300 mg of calcium at each meal. Foods that contain 200-500 mg of calcium a serving include: 8 oz (237 mL) of milk, calcium-fortifiednon-dairy milk, and calcium-fortifiedfruit juice. Calcium-fortified means that calcium has been added to these drinks. 8 oz (237 mL) of kefir, yogurt, and soy yogurt. 4 oz (114 g) of tofu. 1 oz (28 g) of cheese. 1 cup (150 g) of dried figs. 1 cup (91 g) of cooked broccoli. One 3 oz (85 g) can of sardines or mackerel. Most people need 1,000-1,500 mg of calcium a day. Talk to your dietitian about how much calcium is recommended for you. Shopping Buy plenty of fresh fruits and vegetables. Most people do not need to avoid fruits and vegetables, even if these foods contain nutrients that may contribute to kidney stones. When shopping for convenience foods, choose: Whole pieces of fruit. Pre-made salads with dressing on the side. Low-fat fruit and yogurt smoothies. Avoid buying frozen meals or prepared deli foods. These can be high in sodium. Look for foods with live cultures, such as yogurt and kefir. Choose high-fiber grains, such as whole-wheat breads, oat bran, and wheat cereals. Cooking Do not add  salt to food when cooking. Place a salt shaker on the table and allow each person to add his or her own salt to taste. Use vegetable protein, such as beans, textured vegetable protein (TVP), or tofu, instead of meat in pasta, casseroles, and soups. Meal planning Eat less salt, if told by your dietitian. To do this: Avoid eating processed or pre-made food. Avoid eating fast food. Eat less animal protein, including cheese, meat, poultry, or fish, if told by your dietitian. To do this: Limit the number of times you have meat, poultry, fish, or cheese each week. Eat a diet free of meat at least 2 days a week. Eat only one serving each day of meat, poultry, fish, or seafood. When you prepare animal protein, cut pieces into small portion sizes. For most meat and fish, one serving is about the size of the palm of your hand. Eat at least five servings of fresh fruits and vegetables each day. To do this: Keep fruits and vegetables on hand for snacks. Eat one piece of fruit or a handful of berries with breakfast. Have a salad and fruit at lunch. Have two kinds of vegetables at dinner. Limit foods that are high in a substance called oxalate. These include: Spinach (cooked), rhubarb, beets, sweet potatoes, and Swiss chard. Peanuts. Potato chips, french fries, and baked potatoes with skin on. Nuts and nut products. Chocolate. If you regularly take a diuretic medicine, make sure to eat at least 1 or 2 servings of fruits or vegetables that are high in potassium each day. These include: Avocado. Banana. Orange, prune,   carrot, or tomato juice. Baked potato. Cabbage. Beans and split peas. Lifestyle  Drink enough fluid to keep your urine pale yellow. This is the most important thing you can do. Spread your fluid intake throughout the day. If you drink alcohol: Limit how much you use to: 0-1 drink a day for women who are not pregnant. 0-2 drinks a day for men. Be aware of how much alcohol is in your  drink. In the [place] one drink equals one 12 oz bottle of beer (355 mL), one 5 oz glass of wine (148 mL), or one 1 oz glass of hard liquor (44 mL). Lose weight if told by your health care provider. Work with your dietitian to find an eating plan and weight loss strategies that work best for you. General information Talk to your health care provider and dietitian about taking daily supplements. You may be told the following depending on your health and the cause of your kidney stones: Not to take supplements with vitamin C. To take a calcium supplement. To take a daily probiotic supplement. To take other supplements such as magnesium, fish oil, or vitamin B6. Take over-the-counter and prescription medicines only as told by your health care provider. These include supplements. What foods should I limit? Limit your intake of the following foods, or eat them as told by your dietitian. Vegetables Spinach. Rhubarb. Beets. Canned vegetables. Pickles. Olives. Baked potatoes with skin. Grains Wheat bran. Baked goods. Salted crackers. Cereals high in sugar. Meats and other proteins Nuts. Nut butters. Large portions of meat, poultry, or fish. Salted, precooked, or cured meats, such as sausages, meat loaves, and hot dogs. Dairy Cheese. Beverages Regular soft drinks. Regular vegetable juice. Seasonings and condiments Seasoning blends with salt. Salad dressings. Soy sauce. Ketchup. Barbecue sauce. Other foods Canned soups. Canned pasta sauce. Casseroles. Pizza. Lasagna. Frozen meals. Potato chips. French fries. The items listed above may not be a complete list of foods and beverages you should limit. Contact a dietitian for more information. What foods should I avoid? Talk to your dietitian about specific foods you should avoid based on the type of kidney stones you have and your overall health. Fruits Grapefruit. The item listed above may not be a complete list of foods and beverages you should  avoid. Contact a dietitian for more information. Summary Kidney stones are deposits of minerals and salts that form inside your kidneys. You can lower your risk of kidney stones by making changes to your diet. The most important thing you can do is drink enough fluid. Drink enough fluid to keep your urine pale yellow. Talk to your dietitian about how much calcium you should have each day, and eat less salt and animal protein as told by your dietitian. This information is not intended to replace advice given to you by your health care provider. Make sure you discuss any questions you have with your health care provider. Document Revised: 11/11/2019 Document Reviewed: 11/11/2019 Elsevier Patient Education  2022 [employer] Inc[employer]  

## 2021-06-14 NOTE — Progress Notes (Signed)
   06/14/2021 2:47 PM   Alison Osborne 1961/10/09 [id number]  Reason for visit: Follow up nephrolithiasis  HPI: She is a 60 year-old female with a history of recurrent calcium oxalate stone disease who underwent left ureteroscopy, laser lithotripsy, and stent placement on 03/24/2020 for an impacted 8 mm left proximal ureteral stone and multiple left renal stones.  Her stent was removed on 04/03/2020.  Follow-up renal ultrasound showed no evidence of hydronephrosis and stable nonobstructing renal stones.  24-hour urine showed low urine volume of 1.5 L, mildly elevated urine calcium of 203, significantly elevated urine sodium of 190, excellent urine citrate of 737, normal urine oxalate of 27.  She has been working to cut back on salt in the diet.  She denies any stone episodes, gross hematuria, or UTIs since her last visit.  I personally viewed and interpreted her KUB today that shows stable nonobstructing stones bilaterally, unchanged significantly from prior CT.  She would like to continue surveillance for her stone disease.  Return precautions discussed extensively.  We discussed general stone prevention strategies including adequate hydration with goal of producing 2.5 L of urine daily, increasing citric acid intake, increasing calcium intake during high oxalate meals, minimizing animal protein, and decreasing salt intake. Information about dietary recommendations given today.   RTC 1 year KUB prior   Billey Co, MD  [facility] [address], [place], [place] [address] [phone]

## 2021-06-21 ENCOUNTER — Other Ambulatory Visit: Payer: Self-pay

## 2021-06-21 ENCOUNTER — Ambulatory Visit
Admission: RE | Admit: 2021-06-21 | Discharge: 2021-06-21 | Disposition: A | Discharge status: Home / Self Care | Payer: BC Managed Care – PPO | Source: Ambulatory Visit | Attending: Family Medicine | Admitting: Family Medicine

## 2021-06-21 DIAGNOSIS — Z1231 Encounter for screening mammogram for malignant neoplasm of breast: Secondary | ICD-10-CM | POA: Insufficient documentation

## 2022-01-22 ENCOUNTER — Other Ambulatory Visit: Payer: Self-pay | Admitting: Podiatry

## 2022-01-22 ENCOUNTER — Encounter: Payer: Self-pay | Admitting: Podiatry

## 2022-01-30 ENCOUNTER — Encounter: Payer: Self-pay | Admitting: Podiatry

## 2022-01-30 ENCOUNTER — Ambulatory Visit
Admission: RE | Admit: 2022-01-30 | Discharge: 2022-01-30 | Disposition: A | Discharge status: Home / Self Care | Payer: BC Managed Care – PPO | Attending: Podiatry | Admitting: Podiatry

## 2022-01-30 ENCOUNTER — Ambulatory Visit: Payer: BC Managed Care – PPO | Admitting: Anesthesiology

## 2022-01-30 ENCOUNTER — Other Ambulatory Visit: Payer: Self-pay

## 2022-01-30 ENCOUNTER — Ambulatory Visit: Payer: Self-pay

## 2022-01-30 ENCOUNTER — Encounter
Admission: RE | Disposition: A | Discharge status: Home / Self Care | Payer: Self-pay | Source: Home / Self Care | Attending: Podiatry

## 2022-01-30 DIAGNOSIS — F419 Anxiety disorder, unspecified: Secondary | ICD-10-CM | POA: Insufficient documentation

## 2022-01-30 DIAGNOSIS — M2042 Other hammer toe(s) (acquired), left foot: Secondary | ICD-10-CM | POA: Diagnosis present

## 2022-01-30 DIAGNOSIS — F32A Depression, unspecified: Secondary | ICD-10-CM | POA: Diagnosis not present

## 2022-01-30 DIAGNOSIS — Z87891 Personal history of nicotine dependence: Secondary | ICD-10-CM | POA: Diagnosis not present

## 2022-01-30 HISTORY — PX: HAMMER TOE SURGERY: SHX385

## 2022-01-30 SURGERY — CORRECTION, HAMMER TOE
Anesthesia: General | Site: Toe | Laterality: Left

## 2022-01-30 MED ORDER — LACTATED RINGERS IV SOLN: INTRAVENOUS | Status: DC

## 2022-01-30 MED ORDER — FENTANYL CITRATE (PF) 100 MCG/2ML IJ SOLN
INTRAMUSCULAR | Status: DC | PRN
  Administered 2022-01-30: 50 ug via INTRAVENOUS

## 2022-01-30 MED ORDER — GLYCOPYRROLATE 0.2 MG/ML IJ SOLN
INTRAMUSCULAR | Status: DC | PRN
  Administered 2022-01-30: .1 mg via INTRAVENOUS

## 2022-01-30 MED ORDER — BUPIVACAINE HCL (PF) 0.25 % IJ SOLN
INTRAMUSCULAR | Status: DC | PRN
  Administered 2022-01-30: 5 mL
  Administered 2022-01-30: 10 mL

## 2022-01-30 MED ORDER — OXYCODONE-ACETAMINOPHEN 5-325 MG PO TABS
1.0000 | ORAL_TABLET | Freq: Four times a day (QID) | ORAL | 0 refills | Status: DC | PRN
Start: 2022-01-30 — End: 2022-06-13

## 2022-01-30 MED ORDER — PROPOFOL 10 MG/ML IV BOLUS
INTRAVENOUS | Status: DC | PRN
  Administered 2022-01-30: 120 mg via INTRAVENOUS

## 2022-01-30 MED ORDER — LIDOCAINE HCL (CARDIAC) PF 100 MG/5ML IV SOSY
PREFILLED_SYRINGE | INTRAVENOUS | Status: DC | PRN
  Administered 2022-01-30: 50 mg via INTRATRACHEAL

## 2022-01-30 MED ORDER — DEXAMETHASONE SODIUM PHOSPHATE 4 MG/ML IJ SOLN
INTRAMUSCULAR | Status: DC | PRN
  Administered 2022-01-30: 4 mg via INTRAVENOUS

## 2022-01-30 MED ORDER — CEFAZOLIN SODIUM-DEXTROSE 2-4 GM/100ML-% IV SOLN
2.0000 g | INTRAVENOUS | Status: AC
  Administered 2022-01-30: 2 g via INTRAVENOUS

## 2022-01-30 MED ORDER — MIDAZOLAM HCL 5 MG/5ML IJ SOLN
INTRAMUSCULAR | Status: DC | PRN
Start: 2022-01-30 — End: 2022-01-30
  Administered 2022-01-30: 2 mg via INTRAVENOUS

## 2022-01-30 SURGICAL SUPPLY — 34 items
"K-Wire 1.1mm (0.045"") x 152 mm (6"")" (Wire) IMPLANT
APL SKNCLS STERI-STRIP NONHPOA (GAUZE/BANDAGES/DRESSINGS)
BENZOIN TINCTURE PRP APPL 2/3 (GAUZE/BANDAGES/DRESSINGS) ×1 IMPLANT
BLADE MINI RND TIP GREEN BEAV (BLADE) ×1 IMPLANT
BLADE OSC/SAGITTAL MD 5.5X18 (BLADE) ×1 IMPLANT
BNDG CMPR 75X41 PLY HI ABS (GAUZE/BANDAGES/DRESSINGS) ×2
BNDG COHESIVE 4X5 TAN ST LF (GAUZE/BANDAGES/DRESSINGS) ×1 IMPLANT
BNDG ESMARK 4X12 TAN STRL LF (GAUZE/BANDAGES/DRESSINGS) ×2 IMPLANT
BNDG STRETCH 4X75 STRL LF (GAUZE/BANDAGES/DRESSINGS) ×3 IMPLANT
CANISTER SUCT 1200ML W/VALVE (MISCELLANEOUS) ×2 IMPLANT
COVER LIGHT HANDLE UNIVERSAL (MISCELLANEOUS) ×4 IMPLANT
DRAPE FLUOR MINI C-ARM 54X84 (DRAPES) ×2 IMPLANT
DURAPREP 26ML APPLICATOR (WOUND CARE) ×2 IMPLANT
ELECT REM PT RETURN 9FT ADLT (ELECTROSURGICAL) ×2
ELECTRODE REM PT RTRN 9FT ADLT (ELECTROSURGICAL) ×1 IMPLANT
GAUZE SPONGE 4X4 12PLY STRL (GAUZE/BANDAGES/DRESSINGS) ×2 IMPLANT
GAUZE XEROFORM 1X8 LF (GAUZE/BANDAGES/DRESSINGS) ×3 IMPLANT
GLOVE SRG 8 PF TXTR STRL LF DI (GLOVE) ×1 IMPLANT
GLOVE SURG ENC MOIS LTX SZ7.5 (GLOVE) ×2 IMPLANT
GLOVE SURG UNDER POLY LF SZ8 (GLOVE) ×2
GOWN STRL REUS W/ TWL LRG LVL3 (GOWN DISPOSABLE) ×2 IMPLANT
GOWN STRL REUS W/TWL LRG LVL3 (GOWN DISPOSABLE) ×4
K-Wire 1.1mm (0.045") x 152 mm (6") (Wire) ×4 IMPLANT
KIT TURNOVER KIT A (KITS) ×2 IMPLANT
NS IRRIG 500ML POUR BTL (IV SOLUTION) ×2 IMPLANT
PACK EXTREMITY ARMC (MISCELLANEOUS) ×2 IMPLANT
PENCIL SMOKE EVACUATOR (MISCELLANEOUS) ×2 IMPLANT
PIN BALLS 3/8 F/.045 WIRE (MISCELLANEOUS) ×5 IMPLANT
STOCKINETTE IMPERVIOUS LG (DRAPES) ×2 IMPLANT
STRIP CLOSURE SKIN 1/4X4 (GAUZE/BANDAGES/DRESSINGS) ×1 IMPLANT
SUT ETHILON 3-0 FS-10 30 BLK (SUTURE) ×4
SUT VIC AB 4-0 RB1 27 (SUTURE) ×2
SUT VIC AB 4-0 RB1 27X BRD (SUTURE) IMPLANT
SUTURE EHLN 3-0 FS-10 30 BLK (SUTURE) IMPLANT

## 2022-01-30 NOTE — Anesthesia Procedure Notes (Signed)
Procedure Name: LMA Insertion ?Date/Time: 01/30/2022 2:01 PM ?Performed by: Mayme Genta, CRNA ?Pre-anesthesia Checklist: Patient identified, Emergency Drugs available, Suction available, Timeout performed and Patient being monitored ?Patient Re-evaluated:Patient Re-evaluated prior to induction ?Oxygen Delivery Method: Circle system utilized ?Preoxygenation: Pre-oxygenation with 100% oxygen ?Induction Type: IV induction ?LMA: LMA inserted ?LMA Size: 4.0 ?Number of attempts: 1 ?Placement Confirmation: positive ETCO2 and breath sounds checked- equal and bilateral ?Tube secured with: Tape ? ? ? ? ?

## 2022-01-30 NOTE — Discharge Instructions (Signed)
[facility]  POST OPERATIVE INSTRUCTIONS FOR DR. FOWLER AND [facility]   Take your medication as prescribed.  Pain medication should be taken only as needed.  Keep the dressing clean, dry and intact.  Keep your foot elevated above the heart level for the first 48 hours.  Walking to the bathroom and brief periods of walking are acceptable, unless we have instructed you to be non-weight bearing.  Always wear your post-op shoe when walking.  Always use your crutches if you are to be non-weight bearing.  Do not take a shower. Baths are permissible as long as the foot is kept out of the water.   Every hour you are awake:  Bend your knee 15 times. Flex foot 15 times Massage calf 15 times  Call [facility] ([phone]) if any of the following problems occur: You develop a temperature or fever. The bandage becomes saturated with blood. Medication does not stop your pain. Injury of the foot occurs. Any symptoms of infection including redness, odor, or red streaks running from wound. 

## 2022-01-30 NOTE — Anesthesia Preprocedure Evaluation (Signed)
Anesthesia Evaluation  ?Patient identified by MRN, date of birth, ID band ?Patient awake ? ? ? ?Reviewed: ?Allergy & Precautions, NPO status , Patient's Chart, lab work & pertinent test results, reviewed documented beta blocker date and time  ? ?History of Anesthesia Complications ?Negative for: history of anesthetic complications ? ?Airway ?Mallampati: II ? ?TM Distance: >3 FB ?Neck ROM: Full ? ? ? Dental ?  ?Pulmonary ?former smoker,  ?  ?breath sounds clear to auscultation ? ? ? ? ? ? Cardiovascular ?(-) angina(-) DOE  ?Rhythm:Regular Rate:Normal ? ? ?  ?Neuro/Psych ?PSYCHIATRIC DISORDERS Anxiety Depression   ? GI/Hepatic ?neg GERD  ,  ?Endo/Other  ? ? Renal/GU ?Renal disease (Stones)  ? ?  ?Musculoskeletal ? ? Abdominal ?  ?Peds ? Hematology ?  ?Anesthesia Other Findings ?Skin cancer ? Reproductive/Obstetrics ? ?  ? ? ? ? ? ? ? ? ? ? ? ? ? ?  ?  ? ? ? ? ? ? ? ?Anesthesia Physical ?Anesthesia Plan ? ?ASA: 2 ? ?Anesthesia Plan: General  ? ?Post-op Pain Management:   ? ?Induction: Intravenous ? ?PONV Risk Score and Plan: 3 and Treatment may vary due to age or medical condition and Ondansetron ? ?Airway Management Planned: LMA ? ?Additional Equipment:  ? ?Intra-op Plan:  ? ?Post-operative Plan: Extubation in OR ? ?Informed Consent: I have reviewed the patients History and Physical, chart, labs and discussed the procedure including the risks, benefits and alternatives for the proposed anesthesia with the patient or authorized representative who has indicated his/her understanding and acceptance.  ? ? ? ? ? ?Plan Discussed with: CRNA and Anesthesiologist ? ?Anesthesia Plan Comments:   ? ? ? ? ? ? ?Anesthesia Quick Evaluation ? ?

## 2022-01-30 NOTE — H&P (Signed)
HISTORY AND PHYSICAL INTERVAL NOTE: ? ?01/30/2022 ? ?1:31 PM ? ?Alison Osborne  has presented today for surgery, with the diagnosis of M20.42 - Hammertoe of left foot ?Z61.096 - Acute foot pain, left.  The various methods of treatment have been discussed with the patient.  No guarantees were given.  After consideration of risks, benefits and other options for treatment, the patient has consented to surgery.  I have reviewed the patients? chart and labs.   ? ? ?A history and physical examination was performed in my office.  The patient was reexamined.  There have been no changes to this history and physical examination. ? ?Alison Osborne A ? ?

## 2022-01-30 NOTE — Anesthesia Postprocedure Evaluation (Signed)
Anesthesia Post Note ? ?Patient: Alison Osborne ? ?Procedure(s) Performed: HAMMER TOE CORRECTION - 2, 3, 4, 5 (Left: Toe) ? ? ?  ?Patient location during evaluation: PACU ?Anesthesia Type: General ?Level of consciousness: awake and alert ?Pain management: pain level controlled ?Vital Signs Assessment: post-procedure vital signs reviewed and stable ?Respiratory status: spontaneous breathing, nonlabored ventilation, respiratory function stable and patient connected to nasal cannula oxygen ?Cardiovascular status: blood pressure returned to baseline and stable ?Postop Assessment: no apparent nausea or vomiting ?Anesthetic complications: no ? ? ?No notable events documented. ? ?Alison Osborne ? ? ? ? ? ?

## 2022-01-30 NOTE — Op Note (Signed)
Operative note ? ? Surgeon:Chidi Shirer Vickki Muff ? ?  Assistant: None ? ?  Preop diagnosis: Hammertoes left foot toes 2,3,4 and 5 ? ?  Postop diagnosis: Same ? ?  Procedure: 1.  PIPJ arthrodesis left second toe with K wire stabilization 2.  PIPJ arthrodesis left third toe with K wire stabilization 3.  PIPJ arthrodesis left fourth toe with K wire stabilization 4.  PIPJ arthroplasty left fifth toe with K wire stabilization 5.  Open flexor tendon release left second, third, fourth, and fifth toes ? ?  EBL: Minimal ? ?  Anesthesia:local and general ? ?  Hemostasis: Ankle tourniquet inflated to 200 mmHg for approximately 45 minutes ? ?  Specimen: None ? ?  Complications: None ? ?  Operative indications:Alison Osborne is an 61 y.o. that presents today for surgical intervention.  The risks/benefits/alternatives/complications have been discussed and consent has been given. ? ?  Procedure:  ?Patient was brought into the OR and placed on the operating table in thesupine position. After anesthesia was obtained theleft lower extremity was prepped and draped in usual sterile fashion. ? ?Attention was directed to the left foot where a longitudinal incision performed at the dorsal aspect of the PIPJ of this of the left second third fourth and fifth toes.  Sharp and blunt dissection carried down to the longus tensor tendons.  These were all reflected proximally.  The second toe had a fusion at the PIPJ.  At this time a small wedge of bone was removed to realign the second toe into a better aligned position.  Next the head of the proximal phalanx and base of the middle phalanx of the third and fourth toes were then excised.  Finally the head of the proximal phalanx of the fifth toe was excised.  At this time further contracture of the toes especially to the distal aspect was noted.  At this time a flexor tendon release was performed from the plantar aspect of the still sulcus of the second third fourth and fifth toes.  A small Beaver blade  was used to create a transverse incision.  Blunt dissection carried down to the long flexor tendon.  This was then transected and the toes 2,3,4 and 5 were all in a much straighter realigned position.  At this time attention was redirected dorsally.  At the level of the PIPJ the 0.045 K wire was driven from the base of the middle phalanx exiting the tip of the toe and retrograded back crossing the PIPJ into the base of the proximal phalanx of toes 2, 3, 4, and 5, good realignment was noted at this time.  X-rays showed good fluoroscopic realignment as well.  The wounds were flushed with copious amounts of irrigation.  At this time the extensor tendons were reapproximated with a 3-0 Vicryl.  The skin was then reapproximated with a 3-0 nylon.  The plantar incisions were closed with a 3-0 nylon as well.  Bulky sterile dressing was applied. ? ?  Patient tolerated the procedure and anesthesia well.  Was transported from the OR to the PACU with all vital signs stable and vascular status intact. To be discharged per routine protocol.  Will follow up in approximately 1 week in the outpatient clinic. ? ?

## 2022-01-30 NOTE — Transfer of Care (Signed)
Immediate Anesthesia Transfer of Care Note ? ?Patient: Alison Osborne ? ?Procedure(s) Performed: HAMMER TOE CORRECTION - 2, 3, 4, 5 (Left: Toe) ? ?Patient Location: PACU ? ?Anesthesia Type: General ? ?Level of Consciousness: awake, alert  and patient cooperative ? ?Airway and Oxygen Therapy: Patient Spontanous Breathing and Patient connected to supplemental oxygen ? ?Post-op Assessment: Post-op Vital signs reviewed, Patient's Cardiovascular Status Stable, Respiratory Function Stable, Patent Airway and No signs of Nausea or vomiting ? ?Post-op Vital Signs: Reviewed and stable ? ?Complications: No notable events documented. ? ?

## 2022-01-31 ENCOUNTER — Encounter: Payer: Self-pay | Admitting: Podiatry

## 2022-02-08 ENCOUNTER — Encounter: Payer: Self-pay | Admitting: Podiatry

## 2022-06-13 ENCOUNTER — Ambulatory Visit
Admission: RE | Admit: 2022-06-13 | Discharge: 2022-06-13 | Disposition: A | Discharge status: Home / Self Care | Payer: BC Managed Care – PPO | Source: Ambulatory Visit | Attending: Urology | Admitting: Urology

## 2022-06-13 ENCOUNTER — Ambulatory Visit
Admission: RE | Admit: 2022-06-13 | Discharge: 2022-06-13 | Disposition: A | Discharge status: Home / Self Care | Payer: BC Managed Care – PPO | Attending: Urology | Admitting: Urology

## 2022-06-13 ENCOUNTER — Encounter: Payer: Self-pay | Admitting: Urology

## 2022-06-13 ENCOUNTER — Ambulatory Visit: Payer: BC Managed Care – PPO | Admitting: Urology

## 2022-06-13 VITALS — BP 124/82 | HR 80 | Ht 68.0 in | Wt 164.0 lb

## 2022-06-13 DIAGNOSIS — Z87442 Personal history of urinary calculi: Secondary | ICD-10-CM

## 2022-06-13 DIAGNOSIS — N200 Calculus of kidney: Secondary | ICD-10-CM

## 2022-06-13 NOTE — Patient Instructions (Signed)
Dietary Guidelines to Help Prevent Kidney Stones Kidney stones are deposits of minerals and salts that form inside your kidneys. Your risk of developing kidney stones may be greater depending on your diet, your lifestyle, the medicines you take, and whether you have certain medical conditions. Most people can lower their chances of developing kidney stones by following the instructions below. Your dietitian may give you more specific instructions depending on your overall health and the type of kidney stones you tend to develop. What are tips for following this plan? Reading food labels  Choose foods with "no salt added" or "low-salt" labels. Limit your salt (sodium) intake to less than 1,500 mg a day. Choose foods with calcium for each meal and snack. Try to eat about 300 mg of calcium at each meal. Foods that contain 200-500 mg of calcium a serving include: 8 oz (237 mL) of milk, calcium-fortifiednon-dairy milk, and calcium-fortifiedfruit juice. Calcium-fortified means that calcium has been added to these drinks. 8 oz (237 mL) of kefir, yogurt, and soy yogurt. 4 oz (114 g) of tofu. 1 oz (28 g) of cheese. 1 cup (150 g) of dried figs. 1 cup (91 g) of cooked broccoli. One 3 oz (85 g) can of sardines or mackerel. Most people need 1,000-1,500 mg of calcium a day. Talk to your dietitian about how much calcium is recommended for you. Shopping Buy plenty of fresh fruits and vegetables. Most people do not need to avoid fruits and vegetables, even if these foods contain nutrients that may contribute to kidney stones. When shopping for convenience foods, choose: Whole pieces of fruit. Pre-made salads with dressing on the side. Low-fat fruit and yogurt smoothies. Avoid buying frozen meals or prepared deli foods. These can be high in sodium. Look for foods with live cultures, such as yogurt and kefir. Choose high-fiber grains, such as whole-wheat breads, oat bran, and wheat cereals. Cooking Do not add  salt to food when cooking. Place a salt shaker on the table and allow each person to add his or her own salt to taste. Use vegetable protein, such as beans, textured vegetable protein (TVP), or tofu, instead of meat in pasta, casseroles, and soups. Meal planning Eat less salt, if told by your dietitian. To do this: Avoid eating processed or pre-made food. Avoid eating fast food. Eat less animal protein, including cheese, meat, poultry, or fish, if told by your dietitian. To do this: Limit the number of times you have meat, poultry, fish, or cheese each week. Eat a diet free of meat at least 2 days a week. Eat only one serving each day of meat, poultry, fish, or seafood. When you prepare animal protein, cut pieces into small portion sizes. For most meat and fish, one serving is about the size of the palm of your hand. Eat at least five servings of fresh fruits and vegetables each day. To do this: Keep fruits and vegetables on hand for snacks. Eat one piece of fruit or a handful of berries with breakfast. Have a salad and fruit at lunch. Have two kinds of vegetables at dinner. Limit foods that are high in a substance called oxalate. These include: Spinach (cooked), rhubarb, beets, sweet potatoes, and Swiss chard. Peanuts. Potato chips, french fries, and baked potatoes with skin on. Nuts and nut products. Chocolate. If you regularly take a diuretic medicine, make sure to eat at least 1 or 2 servings of fruits or vegetables that are high in potassium each day. These include: Avocado. Banana. Orange, prune,   carrot, or tomato juice. Baked potato. Cabbage. Beans and split peas. Lifestyle  Drink enough fluid to keep your urine pale yellow. This is the most important thing you can do. Spread your fluid intake throughout the day. If you drink alcohol: Limit how much you use to: 0-1 drink a day for women who are not pregnant. 0-2 drinks a day for men. Be aware of how much alcohol is in your  drink. In the [place] one drink equals one 12 oz bottle of beer (355 mL), one 5 oz glass of wine (148 mL), or one 1 oz glass of hard liquor (44 mL). Lose weight if told by your health care provider. Work with your dietitian to find an eating plan and weight loss strategies that work best for you. General information Talk to your health care provider and dietitian about taking daily supplements. You may be told the following depending on your health and the cause of your kidney stones: Not to take supplements with vitamin C. To take a calcium supplement. To take a daily probiotic supplement. To take other supplements such as magnesium, fish oil, or vitamin B6. Take over-the-counter and prescription medicines only as told by your health care provider. These include supplements. What foods should I limit? Limit your intake of the following foods, or eat them as told by your dietitian. Vegetables Spinach. Rhubarb. Beets. Canned vegetables. Pickles. Olives. Baked potatoes with skin. Grains Wheat bran. Baked goods. Salted crackers. Cereals high in sugar. Meats and other proteins Nuts. Nut butters. Large portions of meat, poultry, or fish. Salted, precooked, or cured meats, such as sausages, meat loaves, and hot dogs. Dairy Cheese. Beverages Regular soft drinks. Regular vegetable juice. Seasonings and condiments Seasoning blends with salt. Salad dressings. Soy sauce. Ketchup. Barbecue sauce. Other foods Canned soups. Canned pasta sauce. Casseroles. Pizza. Lasagna. Frozen meals. Potato chips. French fries. The items listed above may not be a complete list of foods and beverages you should limit. Contact a dietitian for more information. What foods should I avoid? Talk to your dietitian about specific foods you should avoid based on the type of kidney stones you have and your overall health. Fruits Grapefruit. The item listed above may not be a complete list of foods and beverages you should  avoid. Contact a dietitian for more information. Summary Kidney stones are deposits of minerals and salts that form inside your kidneys. You can lower your risk of kidney stones by making changes to your diet. The most important thing you can do is drink enough fluid. Drink enough fluid to keep your urine pale yellow. Talk to your dietitian about how much calcium you should have each day, and eat less salt and animal protein as told by your dietitian. This information is not intended to replace advice given to you by your health care provider. Make sure you discuss any questions you have with your health care provider. Document Revised: 07/30/2021 Document Reviewed: 07/30/2021 Elsevier Patient Education  2023 [employer] Inc.  

## 2022-06-13 NOTE — Progress Notes (Signed)
   06/13/2022 3:05 PM   EZZIE SENAT 1960-12-04 [id number]  Reason for visit: Follow up nephrolithiasis  HPI: She is a 61 year-old female with a history of recurrent calcium oxalate stone disease who underwent left ureteroscopy, laser lithotripsy, and stent placement on 03/24/2020 for an impacted 8 mm left proximal ureteral stone and multiple left renal stones.  Her stent was removed on 04/03/2020.  Follow-up renal ultrasound showed no evidence of hydronephrosis and stable nonobstructing renal stones.  24-hour urine showed low urine volume of 1.5 L, mildly elevated urine calcium of 203, significantly elevated urine sodium of 190, excellent urine citrate of 737, normal urine oxalate of 27.    She denies any stone episodes, gross hematuria, or UTIs since her last visit.  I personally viewed and interpreted her KUB today that shows stable nonobstructing stones bilaterally, unchanged significantly from prior KUB last year.  She would like to continue surveillance for her stone disease.  We discussed alternatives including stage II bilateral ureteroscopy for management of her moderate nonobstructing stone burden.  Return precautions discussed extensively.  We discussed general stone prevention strategies including adequate hydration with goal of producing 2.5 L of urine daily, increasing citric acid intake, increasing calcium intake during high oxalate meals, minimizing animal protein, and decreasing salt intake. Information about dietary recommendations given today.   RTC 1 year KUB prior Consider bilateral ureteroscopy and laser lithotripsy if patient desires for moderate nonobstructing stone burden   Billey Co, MD  [facility] [address], [place], [place] [address] [phone]

## 2023-01-08 ENCOUNTER — Other Ambulatory Visit: Payer: Self-pay | Admitting: Podiatry

## 2023-01-23 ENCOUNTER — Encounter: Payer: Self-pay | Admitting: Podiatry

## 2023-01-23 ENCOUNTER — Other Ambulatory Visit: Payer: Self-pay

## 2023-01-29 ENCOUNTER — Ambulatory Visit: Payer: BC Managed Care – PPO | Admitting: General Practice

## 2023-01-29 ENCOUNTER — Other Ambulatory Visit: Payer: Self-pay

## 2023-01-29 ENCOUNTER — Encounter: Payer: Self-pay | Admitting: Podiatry

## 2023-01-29 ENCOUNTER — Ambulatory Visit
Admission: RE | Admit: 2023-01-29 | Discharge: 2023-01-29 | Disposition: A | Discharge status: Home / Self Care | Payer: BC Managed Care – PPO | Attending: Podiatry | Admitting: Podiatry

## 2023-01-29 ENCOUNTER — Encounter
Admission: RE | Disposition: A | Discharge status: Home / Self Care | Payer: Self-pay | Source: Home / Self Care | Attending: Podiatry

## 2023-01-29 DIAGNOSIS — Z87891 Personal history of nicotine dependence: Secondary | ICD-10-CM | POA: Diagnosis not present

## 2023-01-29 DIAGNOSIS — F32A Depression, unspecified: Secondary | ICD-10-CM | POA: Diagnosis not present

## 2023-01-29 DIAGNOSIS — M2041 Other hammer toe(s) (acquired), right foot: Secondary | ICD-10-CM | POA: Diagnosis present

## 2023-01-29 DIAGNOSIS — M24574 Contracture, right foot: Secondary | ICD-10-CM | POA: Insufficient documentation

## 2023-01-29 DIAGNOSIS — Z85828 Personal history of other malignant neoplasm of skin: Secondary | ICD-10-CM | POA: Diagnosis not present

## 2023-01-29 HISTORY — PX: HAMMER TOE SURGERY: SHX385

## 2023-01-29 SURGERY — CORRECTION, HAMMER TOE
Anesthesia: General | Site: Toe | Laterality: Right

## 2023-01-29 MED ORDER — LIDOCAINE HCL (CARDIAC) PF 100 MG/5ML IV SOSY
PREFILLED_SYRINGE | INTRAVENOUS | Status: DC | PRN
  Administered 2023-01-29: 30 mg via INTRATRACHEAL

## 2023-01-29 MED ORDER — OXYCODONE-ACETAMINOPHEN 5-325 MG PO TABS: 1.0000 | ORAL_TABLET | Freq: Four times a day (QID) | ORAL | 0 refills | Status: DC | PRN

## 2023-01-29 MED ORDER — 0.9 % SODIUM CHLORIDE (POUR BTL) OPTIME
TOPICAL | Status: DC | PRN
  Administered 2023-01-29: 100 mL

## 2023-01-29 MED ORDER — BUPIVACAINE HCL (PF) 0.25 % IJ SOLN
INTRAMUSCULAR | Status: DC | PRN
  Administered 2023-01-29: 8.5 mL

## 2023-01-29 MED ORDER — FENTANYL CITRATE PF 50 MCG/ML IJ SOSY: 25.0000 ug | PREFILLED_SYRINGE | INTRAMUSCULAR | Status: DC | PRN

## 2023-01-29 MED ORDER — CEFAZOLIN SODIUM-DEXTROSE 2-4 GM/100ML-% IV SOLN
2.0000 g | INTRAVENOUS | Status: AC
  Administered 2023-01-29: 2 g via INTRAVENOUS

## 2023-01-29 MED ORDER — PROPOFOL 10 MG/ML IV BOLUS
INTRAVENOUS | Status: DC | PRN
  Administered 2023-01-29: 150 mg via INTRAVENOUS
  Administered 2023-01-29: 50 mg via INTRAVENOUS

## 2023-01-29 MED ORDER — OXYCODONE HCL 5 MG PO TABS: 5.0000 mg | ORAL_TABLET | Freq: Once | ORAL | Status: DC | PRN

## 2023-01-29 MED ORDER — FENTANYL CITRATE (PF) 100 MCG/2ML IJ SOLN
INTRAMUSCULAR | Status: DC | PRN
  Administered 2023-01-29: 25 ug via INTRAVENOUS
  Administered 2023-01-29: 50 ug via INTRAVENOUS
  Administered 2023-01-29: 25 ug via INTRAVENOUS

## 2023-01-29 MED ORDER — LACTATED RINGERS IV SOLN: INTRAVENOUS | Status: DC

## 2023-01-29 MED ORDER — MIDAZOLAM HCL 5 MG/5ML IJ SOLN
INTRAMUSCULAR | Status: DC | PRN
  Administered 2023-01-29: 2 mg via INTRAVENOUS

## 2023-01-29 MED ORDER — DEXAMETHASONE SODIUM PHOSPHATE 4 MG/ML IJ SOLN
INTRAMUSCULAR | Status: DC | PRN
  Administered 2023-01-29: 4 mg via INTRAVENOUS

## 2023-01-29 MED ORDER — EPHEDRINE SULFATE (PRESSORS) 50 MG/ML IJ SOLN
INTRAMUSCULAR | Status: DC | PRN
  Administered 2023-01-29 (×2): 5 mg via INTRAVENOUS

## 2023-01-29 MED ORDER — OXYCODONE HCL 5 MG/5ML PO SOLN: 5.0000 mg | Freq: Once | ORAL | Status: DC | PRN

## 2023-01-29 MED ORDER — ONDANSETRON HCL 4 MG/2ML IJ SOLN
INTRAMUSCULAR | Status: DC | PRN
  Administered 2023-01-29: 4 mg via INTRAVENOUS

## 2023-01-29 MED ORDER — BUPIVACAINE LIPOSOME 1.3 % IJ SUSP
INTRAMUSCULAR | Status: DC | PRN
  Administered 2023-01-29: 8.5 mL

## 2023-01-29 SURGICAL SUPPLY — 33 items
BLADE MINI RND TIP GREEN BEAV (BLADE) ×2 IMPLANT
BLADE OSC/SAGITTAL MD 5.5X18 (BLADE) ×1 IMPLANT
BLADE SURG 15 STRL LF DISP TIS (BLADE) ×1 IMPLANT
BLADE SURG 15 STRL SS (BLADE) ×2
BNDG CMPR 5X4 CHSV STRCH STRL (GAUZE/BANDAGES/DRESSINGS) ×2
BNDG CMPR 75X41 PLY HI ABS (GAUZE/BANDAGES/DRESSINGS) ×4
BNDG COHESIVE 4X5 TAN STRL LF (GAUZE/BANDAGES/DRESSINGS) ×1 IMPLANT
BNDG ESMARCH 4 X 12 STRL LF (GAUZE/BANDAGES/DRESSINGS) ×2
BNDG ESMARCH 4X12 STRL LF (GAUZE/BANDAGES/DRESSINGS) ×1 IMPLANT
BNDG STRETCH 4X75 STRL LF (GAUZE/BANDAGES/DRESSINGS) ×3 IMPLANT
CANISTER SUCT 1200ML W/VALVE (MISCELLANEOUS) ×2 IMPLANT
COVER LIGHT HANDLE UNIVERSAL (MISCELLANEOUS) ×4 IMPLANT
CUFF TOURN SGL QUICK 18X4 (TOURNIQUET CUFF) IMPLANT
DRAPE FLUOR MINI C-ARM 54X84 (DRAPES) ×2 IMPLANT
DURAPREP 26ML APPLICATOR (WOUND CARE) ×2 IMPLANT
ELECT REM PT RETURN 9FT ADLT (ELECTROSURGICAL) ×2
ELECTRODE REM PT RTRN 9FT ADLT (ELECTROSURGICAL) ×2 IMPLANT
GAUZE SPONGE 4X4 12PLY STRL (GAUZE/BANDAGES/DRESSINGS) ×2 IMPLANT
GAUZE XEROFORM 1X8 LF (GAUZE/BANDAGES/DRESSINGS) ×2 IMPLANT
GLOVE SRG 8 PF TXTR STRL LF DI (GLOVE) ×3 IMPLANT
GLOVE SURG ENC MOIS LTX SZ7.5 (GLOVE) ×3 IMPLANT
GLOVE SURG UNDER POLY LF SZ8 (GLOVE) ×4
GOWN STRL REUS W/ TWL LRG LVL3 (GOWN DISPOSABLE) ×4 IMPLANT
GOWN STRL REUS W/TWL LRG LVL3 (GOWN DISPOSABLE) ×4
KIT TURNOVER KIT A (KITS) ×2 IMPLANT
MICROAIRE K-WIRE 0.045 (Wire) ×4 IMPLANT
NS IRRIG 500ML POUR BTL (IV SOLUTION) ×2 IMPLANT
PACK EXTREMITY ARMC (MISCELLANEOUS) ×2 IMPLANT
PIN BALLS 3/8 F/.045 WIRE (MISCELLANEOUS) ×3 IMPLANT
STOCKINETTE IMPERVIOUS LG (DRAPES) ×2 IMPLANT
SUT ETHILON 3-0 (SUTURE) ×3 IMPLANT
SUT VIC AB 3-0 SH 27 (SUTURE) ×2
SUT VIC AB 3-0 SH 27X BRD (SUTURE) ×1 IMPLANT

## 2023-01-29 NOTE — Op Note (Signed)
Operative note   Surgeon:Loyd Salvador Lawyer: None    Preop diagnosis: Hammertoe right second third fourth and fifth toes    Postop diagnosis: 1.  Hammertoe right second third fourth and fifth toes 2.  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  PIPJ arthrodesis right second toe 2.  PIPJ arthrodesis right third toe 3.  PIPJ arthrodesis right fourth toe 4.  PIPJ arthroplasty right fifth toe 5.  Metatarsophalangeal joint release right second toe joint 6.  Metatarsophalangeal joint release right third toe joint 7.  Metatarsal phalangeal joint right fourth toe joint 5.  Intraoperative fluoroscopy without assistance of radiologist    EBL: Minimal    Anesthesia:local and general.  Local consisted of a total of 10 cc of a one-to-one mixture of 0.25% bupivacaine plain and Exparel long-acting anesthetic in a local block fashion around each individual toe    Hemostasis: Midcalf tourniquet inflated to 200 mmHg for approximately 70 minutes    Specimen: None    Complications: None    Operative indications:Alison Osborne is an 62 y.o. that presents today for surgical intervention.  The risks/benefits/alternatives/complications have been discussed and consent has been given.    Procedure:  Patient was brought into the OR and placed on the operating table in thesupine position. After anesthesia was obtained theright lower extremity was prepped and draped in usual sterile fashion.  Attention was directed to the dorsal aspect of the second third fourth and fifth toes where a longitudinal incision was performed overlying the PIPJ of each joint.  The extensor tendon was noted to each toe and the second third fourth and fifth toe extensor tendon was released.  This exposed the head of the proximal phalanx and base of the middle phalanx.  The head of the proximal phalanx was then removed with a power saw from the second third fourth and fifth toes.  The  base of the middle phalanx was removed from the second third and fourth toes.  This time good realignment with release of the joints were noted.  The toes were then pinned in a straightened position with a 0.045 K wire.  This was driven from the base of the middle phalanx to the tip of the toe and retrograded back to the proximal phalanx on the second third fourth and fifth toes.  Mild residual dorsal contracture at the metatarsophalangeal joint was noted at this time metatarsophalangeal joint releases were performed.  Attention was directed to the dorsal aspect of the second, third, and fourth metatarsophalangeal joints and longitudinal incisions were performed.  Sharp and blunt dissection carried down to the extensor tendon.  A extensor tendon lengthening tenotomy was performed at this level.  The dorsal medial and lateral capsule were released from the second third and fourth toes.  Mild residual dorsal positioning of the second toe was noted and the plantar plate was released with a McGlamry elevator.  At this time all digits were noted to be nice and straight.  Good alignment was noted on all planes on fluoroscopy at this point.  All wounds were then flushed with copious amounts of irrigation.  Closure was performed with 3-0 Vicryl for the extensor tendons for reanastomosis at the PIPJ and MTPJ's.  Subcutaneous tissue was reanastomosed with a 3-0 Vicryl.  The skin reanastomosed with a 3-0 nylon.  The tourniquet was released and good perfusion to all of the digits were noted.  A bulky sterile dressing was applied.  Patient tolerated the procedure and anesthesia well.  Was transported from the OR to the PACU with all vital signs stable and vascular status intact. To be discharged per routine protocol.  Will follow up in approximately 1 week in the outpatient clinic.

## 2023-01-29 NOTE — H&P (Signed)
HISTORY AND PHYSICAL INTERVAL NOTE:  01/29/2023  1:58 PM  Alison Osborne  has presented today for surgery, with the diagnosis of M20.41 - hammertoe of right foot.  The various methods of treatment have been discussed with the patient.  No guarantees were given.  After consideration of risks, benefits and other options for treatment, the patient has consented to surgery.  I have reviewed the patients' chart and labs.     A history and physical examination was performed in my office.  The patient was reexamined.  There have been no changes to this history and physical examination.  [name]

## 2023-01-29 NOTE — Anesthesia Postprocedure Evaluation (Signed)
Anesthesia Post Note  Patient: Alison Osborne  Procedure(s) Performed: HAMMER TOE CORRECTION - 2,3,4,5 (Right: Toe)  Patient location during evaluation: PACU Anesthesia Type: General Level of consciousness: awake and alert Pain management: pain level controlled Vital Signs Assessment: post-procedure vital signs reviewed and stable Respiratory status: spontaneous breathing, nonlabored ventilation, respiratory function stable and patient connected to nasal cannula oxygen Cardiovascular status: blood pressure returned to baseline and stable Postop Assessment: no apparent nausea or vomiting Anesthetic complications: no   No notable events documented.   Last Vitals:  Vitals:   01/29/23 1600 01/29/23 1615  BP: 130/81 (!) 138/93  Pulse: 73 74  Resp: 19 14  Temp:  (!) 36.2 C  SpO2: 97% 100%    Last Pain:  Vitals:   01/29/23 1615  TempSrc:   PainSc: 0-No pain                 Precious Haws Arby Dahir

## 2023-01-29 NOTE — Discharge Instructions (Signed)
[facility]  POST OPERATIVE INSTRUCTIONS FOR DR. FOWLER AND [facility]   Take your medication as prescribed.  Pain medication should be taken only as needed.  Keep the dressing clean, dry and intact.  Keep your foot elevated above the heart level for the first 48 hours.  Walking to the bathroom and brief periods of walking are acceptable, unless we have instructed you to be non-weight bearing.  Always wear your post-op shoe when walking.  Always use your crutches if you are to be non-weight bearing.  Do not take a shower. Baths are permissible as long as the foot is kept out of the water.   Every hour you are awake:  Bend your knee 15 times. Flex foot 15 times Massage calf 15 times  Call [facility] ([phone]) if any of the following problems occur: You develop a temperature or fever. The bandage becomes saturated with blood. Medication does not stop your pain. Injury of the foot occurs. Any symptoms of infection including redness, odor, or red streaks running from wound. 

## 2023-01-29 NOTE — Transfer of Care (Signed)
Immediate Anesthesia Transfer of Care Note  Patient: Alison Osborne  Procedure(s) Performed: HAMMER TOE CORRECTION - 2,3,4,5 (Right: Toe) FLEXOR TENOTOMY - 2,3,4,5 (Right: Toe)  Patient Location: PACU  Anesthesia Type: General LMA  Level of Consciousness: awake, alert  and patient cooperative  Airway and Oxygen Therapy: Patient Spontanous Breathing and Patient connected to supplemental oxygen  Post-op Assessment: Post-op Vital signs reviewed, Patient's Cardiovascular Status Stable, Respiratory Function Stable, Patent Airway and No signs of Nausea or vomiting  Post-op Vital Signs: Reviewed and stable  Complications: No notable events documented.

## 2023-01-29 NOTE — Anesthesia Preprocedure Evaluation (Signed)
Anesthesia Evaluation  Patient identified by MRN, date of birth, ID band Patient awake    Reviewed: Allergy & Precautions, NPO status , Patient's Chart, lab work & pertinent test results  History of Anesthesia Complications Negative for: history of anesthetic complications  Airway Mallampati: III  TM Distance: >3 FB Neck ROM: full    Dental  (+) Chipped, Poor Dentition   Pulmonary neg shortness of breath, former smoker   Pulmonary exam normal        Cardiovascular Exercise Tolerance: Good (-) angina (-) Past MI negative cardio ROS Normal cardiovascular exam     Neuro/Psych  PSYCHIATRIC DISORDERS      negative neurological ROS     GI/Hepatic negative GI ROS, Neg liver ROS,,,  Endo/Other  negative endocrine ROS    Renal/GU Renal disease     Musculoskeletal   Abdominal   Peds  Hematology negative hematology ROS (+)   Anesthesia Other Findings Past Medical History: No date: Anxiety No date: Cancer ([place])     Comment:  skin cancer - squamous carcina No date: Depression No date: Renal disorder     Comment:  stones  Past Surgical History: No date: CESAREAN SECTION 01/16/2016: CYSTOSCOPY W/ URETERAL STENT REMOVAL; Bilateral     Comment:  Procedure: CYSTOSCOPY WITH STENT REMOVAL;  Surgeon:               Royston Cowper, MD;  Location: [facility];  Service:               Urology;  Laterality: Bilateral; 11/09/2015: CYSTOSCOPY WITH STENT PLACEMENT; Bilateral     Comment:  Procedure: CYSTOSCOPY WITH STENT PLACEMENT;  Surgeon:               Royston Cowper, MD;  Location: [facility];  Service:               Urology;  Laterality: Bilateral; 03/24/2020: CYSTOSCOPY/URETEROSCOPY/HOLMIUM LASER/STENT PLACEMENT; Left     Comment:  Procedure: CYSTOSCOPY/URETEROSCOPY/HOLMIUM LASER/STENT               PLACEMENT;  Surgeon: Billey Co, MD;  Location:               [facility];  Service: Urology;  Laterality: Left; 11/16/2015:  EXTRACORPOREAL SHOCK WAVE LITHOTRIPSY; Left     Comment:  Procedure: EXTRACORPOREAL SHOCK WAVE LITHOTRIPSY (ESWL);              Surgeon: Royston Cowper, MD;  Location: [facility];                Service: Urology;  Laterality: Left; 11/30/2015: EXTRACORPOREAL SHOCK WAVE LITHOTRIPSY; Right     Comment:  Procedure: EXTRACORPOREAL SHOCK WAVE LITHOTRIPSY (ESWL);              Surgeon: Royston Cowper, MD;  Location: [facility];                Service: Urology;  Laterality: Right; 01/04/2016: EXTRACORPOREAL SHOCK WAVE LITHOTRIPSY; Left     Comment:  Procedure: EXTRACORPOREAL SHOCK WAVE LITHOTRIPSY (ESWL);              Surgeon: Royston Cowper, MD;  Location: [facility];                Service: Urology;  Laterality: Left; 01/30/2022: HAMMER TOE SURGERY; Left     Comment:  Procedure: HAMMER TOE CORRECTION - 2, 3, 4, 5;  Surgeon:  Samara Deist, DPM;  Location: [facility];                Service: Podiatry;  Laterality: Left;  general with local No date: TUBAL LIGATION  BMI    Body Mass Index: 23.57 kg/m      Reproductive/Obstetrics negative OB ROS                             Anesthesia Physical Anesthesia Plan  ASA: 2  Anesthesia Plan: General LMA   Post-op Pain Management:    Induction: Intravenous  PONV Risk Score and Plan: Dexamethasone, Ondansetron, Midazolam and Treatment may vary due to age or medical condition  Airway Management Planned: LMA  Additional Equipment:   Intra-op Plan:   Post-operative Plan: Extubation in OR  Informed Consent: I have reviewed the patients History and Physical, chart, labs and discussed the procedure including the risks, benefits and alternatives for the proposed anesthesia with the patient or authorized representative who has indicated his/her understanding and acceptance.     Dental Advisory Given  Plan Discussed with: Anesthesiologist, CRNA and Surgeon  Anesthesia Plan Comments: (Patient consented  for risks of anesthesia including but not limited to:  - adverse reactions to medications - damage to eyes, teeth, lips or other oral mucosa - nerve damage due to positioning  - sore throat or hoarseness - Damage to heart, brain, nerves, lungs, other parts of body or loss of life  Patient voiced understanding.)       Anesthesia Quick Evaluation

## 2023-01-29 NOTE — Anesthesia Procedure Notes (Signed)
Procedure Name: LMA Insertion Date/Time: 01/29/2023 2:30 PM  Performed by: Moises Blood, CRNAPre-anesthesia Checklist: Patient identified, Emergency Drugs available, Suction available, Patient being monitored and Timeout performed Patient Re-evaluated:Patient Re-evaluated prior to induction Oxygen Delivery Method: Circle system utilized Preoxygenation: Pre-oxygenation with 100% oxygen Induction Type: IV induction LMA: LMA inserted LMA Size: 4.0 Number of attempts: 1 Dental Injury: Teeth and Oropharynx as per pre-operative assessment

## 2023-01-30 ENCOUNTER — Encounter: Payer: Self-pay | Admitting: Podiatry

## 2023-04-25 ENCOUNTER — Ambulatory Visit: Payer: BC Managed Care – PPO

## 2023-04-25 DIAGNOSIS — K640 First degree hemorrhoids: Secondary | ICD-10-CM

## 2023-04-25 DIAGNOSIS — Z83719 Family history of colon polyps, unspecified: Secondary | ICD-10-CM

## 2023-04-25 DIAGNOSIS — Z1211 Encounter for screening for malignant neoplasm of colon: Secondary | ICD-10-CM

## 2023-04-25 DIAGNOSIS — K5730 Diverticulosis of large intestine without perforation or abscess without bleeding: Secondary | ICD-10-CM

## 2023-06-19 ENCOUNTER — Other Ambulatory Visit: Payer: Self-pay

## 2023-06-19 ENCOUNTER — Ambulatory Visit
Admission: RE | Admit: 2023-06-19 | Discharge: 2023-06-19 | Disposition: A | Discharge status: Home / Self Care | Payer: BC Managed Care – PPO | Attending: Urology | Admitting: Urology

## 2023-06-19 ENCOUNTER — Ambulatory Visit
Admission: RE | Admit: 2023-06-19 | Discharge: 2023-06-19 | Disposition: A | Discharge status: Home / Self Care | Payer: BC Managed Care – PPO | Source: Ambulatory Visit | Attending: Urology | Admitting: Urology

## 2023-06-19 ENCOUNTER — Encounter: Payer: Self-pay | Admitting: Urology

## 2023-06-19 ENCOUNTER — Ambulatory Visit (INDEPENDENT_AMBULATORY_CARE_PROVIDER_SITE_OTHER): Payer: BC Managed Care – PPO | Admitting: Urology

## 2023-06-19 VITALS — BP 130/84 | HR 64 | Ht 68.0 in | Wt 147.2 lb

## 2023-06-19 DIAGNOSIS — N200 Calculus of kidney: Secondary | ICD-10-CM | POA: Insufficient documentation

## 2023-06-19 DIAGNOSIS — Z87442 Personal history of urinary calculi: Secondary | ICD-10-CM

## 2023-06-19 DIAGNOSIS — Z09 Encounter for follow-up examination after completed treatment for conditions other than malignant neoplasm: Secondary | ICD-10-CM

## 2023-06-19 NOTE — Progress Notes (Signed)
   06/19/2023 1:20 PM   Alison Osborne 07-05-1961 [id number]  Reason for visit: Follow up nephrolithiasis  HPI: She is a 62 year-old female with a history of recurrent calcium oxalate stone disease who underwent left ureteroscopy, laser lithotripsy, and stent placement on 03/24/2020 for an impacted 8 mm left proximal ureteral stone and multiple left renal stones.  Her stent was removed on 04/03/2020.  Follow-up renal ultrasound showed no evidence of hydronephrosis.  24-hour urine showed low urine volume of 1.5 L, mildly elevated urine calcium of 203, significantly elevated urine sodium of 190, excellent urine citrate of 737, normal urine oxalate of 27.    She denies any stone episodes, gross hematuria, or UTIs since her last visit.  I personally viewed and interpreted her KUB today that shows stable to slightly increased nonobstructing stones bilaterally.  She would like to continue surveillance for her stone disease.  We discussed alternatives including staged bilateral ureteroscopy for management of her moderate nonobstructing stone burden.  Return precautions discussed extensively.  We discussed general stone prevention strategies including adequate hydration with goal of producing 2.5 L of urine daily, increasing citric acid intake, increasing calcium intake during high oxalate meals, minimizing animal protein, and decreasing salt intake. Information about dietary recommendations given today.   -RTC 1 year KUB prior -Consider bilateral ureteroscopy and laser lithotripsy if patient desires for moderate nonobstructing stone burden   Sondra Come, MD  [facility] [address] [place], [place] [address] [phone]

## 2023-09-08 ENCOUNTER — Ambulatory Visit: Payer: BC Managed Care – PPO

## 2023-09-08 DIAGNOSIS — Z23 Encounter for immunization: Secondary | ICD-10-CM | POA: Diagnosis not present

## 2023-09-08 DIAGNOSIS — Z719 Counseling, unspecified: Secondary | ICD-10-CM

## 2023-09-08 NOTE — Progress Notes (Signed)
Patient seen in nurse clinic for COVID vaccine. Comirnaty 2024-25 +12Y IM left deltoid. [facility] updated and copy provided. VIS provided. Patient declined to wait afterwards.

## 2024-06-18 ENCOUNTER — Other Ambulatory Visit: Payer: Self-pay

## 2024-06-18 DIAGNOSIS — N200 Calculus of kidney: Secondary | ICD-10-CM

## 2024-06-24 ENCOUNTER — Ambulatory Visit (INDEPENDENT_AMBULATORY_CARE_PROVIDER_SITE_OTHER): Payer: Self-pay | Admitting: Urology

## 2024-06-24 ENCOUNTER — Ambulatory Visit
Admission: RE | Admit: 2024-06-24 | Discharge: 2024-06-24 | Disposition: A | Discharge status: Home / Self Care | Source: Ambulatory Visit | Attending: Urology

## 2024-06-24 ENCOUNTER — Ambulatory Visit
Admission: RE | Admit: 2024-06-24 | Discharge: 2024-06-24 | Disposition: A | Discharge status: Home / Self Care | Attending: Urology | Admitting: Urology

## 2024-06-24 VITALS — BP 135/85 | HR 62 | Ht 68.0 in | Wt 151.0 lb

## 2024-06-24 DIAGNOSIS — N200 Calculus of kidney: Secondary | ICD-10-CM

## 2024-06-24 NOTE — Patient Instructions (Signed)
 SABRA

## 2024-06-24 NOTE — Progress Notes (Signed)
   06/24/2024 1:42 PM   Alison Osborne Apr 02, 1961 [id number]  Reason for visit: Follow up nephrolithiasis  HPI: She is a 63 year-old female with a history of recurrent calcium oxalate stone disease who underwent left ureteroscopy, laser lithotripsy, and stent placement on 03/24/2020 for an impacted 8 mm left proximal ureteral stone and multiple left renal stones.  Her stent was removed on 04/03/2020.  Follow-up renal ultrasound showed no evidence of hydronephrosis.  24-hour urine showed low urine volume of 1.5 L, mildly elevated urine calcium of 203, significantly elevated urine sodium of 190, excellent urine citrate of 737, normal urine oxalate of 27.    She denies any stone episodes, gross hematuria, or UTIs since her last visit.  I personally viewed and interpreted her KUB today that shows stable nonobstructing stones bilaterally.  She would like to continue surveillance for her stone disease.  We discussed alternatives including staged bilateral ureteroscopy for management of her moderate nonobstructing stone burden.  Return precautions discussed extensively.  We discussed general stone prevention strategies including adequate hydration with goal of producing 2.5 L of urine daily, increasing citric acid intake, increasing calcium intake during high oxalate meals, minimizing animal protein, and decreasing salt intake. Information about dietary recommendations given today.   -RTC 1 year KUB prior -Consider bilateral ureteroscopy and laser lithotripsy if patient desires for moderate nonobstructing stone burden   [name], MD   [facility] [address] [place], [place] [address] [phone]

## 2025-06-29 ENCOUNTER — Ambulatory Visit: Admitting: Urology
